# Patient Record
Sex: Male | Born: 1945 | Race: Black or African American | Hispanic: No | Smoking: Former smoker
Health system: Southern US, Community
[De-identification: ages and names within clinical notes are randomized; demographics above are authoritative.]

## PROBLEM LIST (undated history)

## (undated) DIAGNOSIS — E079 Disorder of thyroid, unspecified: Secondary | ICD-10-CM

## (undated) DIAGNOSIS — I739 Peripheral vascular disease, unspecified: Secondary | ICD-10-CM

## (undated) DIAGNOSIS — I2510 Atherosclerotic heart disease of native coronary artery without angina pectoris: Secondary | ICD-10-CM

## (undated) DIAGNOSIS — I10 Essential (primary) hypertension: Secondary | ICD-10-CM

## (undated) DIAGNOSIS — E119 Type 2 diabetes mellitus without complications: Secondary | ICD-10-CM

## (undated) DIAGNOSIS — I4892 Unspecified atrial flutter: Secondary | ICD-10-CM

## (undated) DIAGNOSIS — J449 Chronic obstructive pulmonary disease, unspecified: Secondary | ICD-10-CM

---

## 2016-07-23 ENCOUNTER — Inpatient Hospital Stay (HOSPITAL_COMMUNITY): Payer: Medicare Other

## 2016-07-23 ENCOUNTER — Encounter (HOSPITAL_COMMUNITY): Payer: Self-pay | Admitting: Emergency Medicine

## 2016-07-23 ENCOUNTER — Emergency Department (HOSPITAL_COMMUNITY): Payer: Medicare Other

## 2016-07-23 ENCOUNTER — Inpatient Hospital Stay (HOSPITAL_COMMUNITY)
Admission: EM | Admit: 2016-07-23 | Discharge: 2016-07-30 | DRG: 004 | Disposition: A | Discharge status: Other Acute Inpatient Hospital | Payer: Medicare Other | Attending: Internal Medicine | Admitting: Internal Medicine

## 2016-07-23 DIAGNOSIS — T17890A Other foreign object in other parts of respiratory tract causing asphyxiation, initial encounter: Principal | ICD-10-CM | POA: Diagnosis present

## 2016-07-23 DIAGNOSIS — I2510 Atherosclerotic heart disease of native coronary artery without angina pectoris: Secondary | ICD-10-CM | POA: Diagnosis not present

## 2016-07-23 DIAGNOSIS — Z430 Encounter for attention to tracheostomy: Secondary | ICD-10-CM | POA: Diagnosis not present

## 2016-07-23 DIAGNOSIS — I252 Old myocardial infarction: Secondary | ICD-10-CM

## 2016-07-23 DIAGNOSIS — J15212 Pneumonia due to Methicillin resistant Staphylococcus aureus: Secondary | ICD-10-CM | POA: Diagnosis present

## 2016-07-23 DIAGNOSIS — E785 Hyperlipidemia, unspecified: Secondary | ICD-10-CM | POA: Diagnosis present

## 2016-07-23 DIAGNOSIS — Z9981 Dependence on supplemental oxygen: Secondary | ICD-10-CM

## 2016-07-23 DIAGNOSIS — R579 Shock, unspecified: Secondary | ICD-10-CM | POA: Diagnosis not present

## 2016-07-23 DIAGNOSIS — Z955 Presence of coronary angioplasty implant and graft: Secondary | ICD-10-CM

## 2016-07-23 DIAGNOSIS — Y95 Nosocomial condition: Secondary | ICD-10-CM | POA: Diagnosis present

## 2016-07-23 DIAGNOSIS — J189 Pneumonia, unspecified organism: Secondary | ICD-10-CM | POA: Diagnosis present

## 2016-07-23 DIAGNOSIS — Z931 Gastrostomy status: Secondary | ICD-10-CM

## 2016-07-23 DIAGNOSIS — J9622 Acute and chronic respiratory failure with hypercapnia: Secondary | ICD-10-CM | POA: Diagnosis present

## 2016-07-23 DIAGNOSIS — J431 Panlobular emphysema: Secondary | ICD-10-CM | POA: Diagnosis not present

## 2016-07-23 DIAGNOSIS — E1151 Type 2 diabetes mellitus with diabetic peripheral angiopathy without gangrene: Secondary | ICD-10-CM | POA: Diagnosis present

## 2016-07-23 DIAGNOSIS — J9601 Acute respiratory failure with hypoxia: Secondary | ICD-10-CM | POA: Insufficient documentation

## 2016-07-23 DIAGNOSIS — R570 Cardiogenic shock: Secondary | ICD-10-CM

## 2016-07-23 DIAGNOSIS — J811 Chronic pulmonary edema: Secondary | ICD-10-CM | POA: Diagnosis present

## 2016-07-23 DIAGNOSIS — G931 Anoxic brain damage, not elsewhere classified: Secondary | ICD-10-CM | POA: Diagnosis not present

## 2016-07-23 DIAGNOSIS — J9611 Chronic respiratory failure with hypoxia: Secondary | ICD-10-CM | POA: Diagnosis not present

## 2016-07-23 DIAGNOSIS — I4892 Unspecified atrial flutter: Secondary | ICD-10-CM | POA: Diagnosis not present

## 2016-07-23 DIAGNOSIS — E876 Hypokalemia: Secondary | ICD-10-CM | POA: Diagnosis not present

## 2016-07-23 DIAGNOSIS — E872 Acidosis: Secondary | ICD-10-CM | POA: Diagnosis present

## 2016-07-23 DIAGNOSIS — X58XXXA Exposure to other specified factors, initial encounter: Secondary | ICD-10-CM | POA: Diagnosis not present

## 2016-07-23 DIAGNOSIS — D649 Anemia, unspecified: Secondary | ICD-10-CM | POA: Diagnosis not present

## 2016-07-23 DIAGNOSIS — J440 Chronic obstructive pulmonary disease with acute lower respiratory infection: Secondary | ICD-10-CM | POA: Diagnosis not present

## 2016-07-23 DIAGNOSIS — Z87891 Personal history of nicotine dependence: Secondary | ICD-10-CM | POA: Diagnosis not present

## 2016-07-23 DIAGNOSIS — I10 Essential (primary) hypertension: Secondary | ICD-10-CM | POA: Diagnosis not present

## 2016-07-23 DIAGNOSIS — J9621 Acute and chronic respiratory failure with hypoxia: Secondary | ICD-10-CM | POA: Diagnosis present

## 2016-07-23 DIAGNOSIS — R092 Respiratory arrest: Secondary | ICD-10-CM | POA: Diagnosis not present

## 2016-07-23 DIAGNOSIS — I469 Cardiac arrest, cause unspecified: Secondary | ICD-10-CM | POA: Diagnosis present

## 2016-07-23 HISTORY — DX: Disorder of thyroid, unspecified: E07.9

## 2016-07-23 HISTORY — DX: Peripheral vascular disease, unspecified: I73.9

## 2016-07-23 HISTORY — DX: Type 2 diabetes mellitus without complications: E11.9

## 2016-07-23 HISTORY — DX: Atherosclerotic heart disease of native coronary artery without angina pectoris: I25.10

## 2016-07-23 HISTORY — DX: Unspecified atrial flutter: I48.92

## 2016-07-23 HISTORY — DX: Essential (primary) hypertension: I10

## 2016-07-23 HISTORY — DX: Chronic obstructive pulmonary disease, unspecified: J44.9

## 2016-07-23 LAB — I-STAT ARTERIAL BLOOD GAS, ED
Acid-Base Excess: 4 mmol/L — ABNORMAL HIGH (ref 0.0–2.0)
Acid-Base Excess: 5 mmol/L — ABNORMAL HIGH (ref 0.0–2.0)
Bicarbonate: 33.7 mmol/L — ABNORMAL HIGH (ref 20.0–28.0)
Bicarbonate: 29 mmol/L — ABNORMAL HIGH (ref 20.0–28.0)
O2 Saturation: 76 %
O2 Saturation: 100 %
PCO2 ART: 78.6 mmHg — AB (ref 32.0–48.0)
PCO2 ART: 38 mmHg (ref 32.0–48.0)
PH ART: 7.236 — AB (ref 7.350–7.450)
PO2 ART: 48 mmHg — AB (ref 83.0–108.0)
Patient temperature: 97.2
Patient temperature: 97.2
TCO2: 36 mmol/L (ref 0–100)
TCO2: 30 mmol/L (ref 0–100)
pH, Arterial: 7.487 — ABNORMAL HIGH (ref 7.350–7.450)
pO2, Arterial: 227 mmHg — ABNORMAL HIGH (ref 83.0–108.0)

## 2016-07-23 LAB — CBC
HCT: 25.5 % — ABNORMAL LOW (ref 39.0–52.0)
Hemoglobin: 7.8 g/dL — ABNORMAL LOW (ref 13.0–17.0)
MCH: 27.3 pg (ref 26.0–34.0)
MCHC: 30.6 g/dL (ref 30.0–36.0)
MCV: 89.2 fL (ref 78.0–100.0)
PLATELETS: 292 10*3/uL (ref 150–400)
RBC: 2.86 MIL/uL — ABNORMAL LOW (ref 4.22–5.81)
RDW: 15.3 % (ref 11.5–15.5)
WBC: 15.4 10*3/uL — ABNORMAL HIGH (ref 4.0–10.5)

## 2016-07-23 LAB — COMPREHENSIVE METABOLIC PANEL
ALBUMIN: 2 g/dL — AB (ref 3.5–5.0)
ALK PHOS: 70 U/L (ref 38–126)
ALK PHOS: 86 U/L (ref 38–126)
ALT: 22 U/L (ref 17–63)
ALT: 29 U/L (ref 17–63)
AST: 60 U/L — AB (ref 15–41)
AST: 53 U/L — ABNORMAL HIGH (ref 15–41)
Albumin: 2.4 g/dL — ABNORMAL LOW (ref 3.5–5.0)
Anion gap: 8 (ref 5–15)
Anion gap: 14 (ref 5–15)
BILIRUBIN TOTAL: 0.8 mg/dL (ref 0.3–1.2)
BUN: 9 mg/dL (ref 6–20)
BUN: 16 mg/dL (ref 6–20)
CALCIUM: 7.3 mg/dL — AB (ref 8.9–10.3)
CALCIUM: 8.6 mg/dL — AB (ref 8.9–10.3)
CHLORIDE: 104 mmol/L (ref 101–111)
CO2: 27 mmol/L (ref 22–32)
CO2: 27 mmol/L (ref 22–32)
CREATININE: 0.86 mg/dL (ref 0.61–1.24)
CREATININE: 1.26 mg/dL — AB (ref 0.61–1.24)
Chloride: 94 mmol/L — ABNORMAL LOW (ref 101–111)
GFR calc Af Amer: >60 mL/min (ref >60)
GFR calc non Af Amer: >60 mL/min (ref >60)
GFR calc non Af Amer: 57 mL/min — ABNORMAL LOW (ref >60)
GLUCOSE: 158 mg/dL — AB (ref 65–99)
GLUCOSE: 141 mg/dL — AB (ref 65–99)
Potassium: 3.7 mmol/L (ref 3.5–5.1)
Potassium: 3.8 mmol/L (ref 3.5–5.1)
SODIUM: 139 mmol/L (ref 135–145)
SODIUM: 135 mmol/L (ref 135–145)
Total Bilirubin: 0.8 mg/dL (ref 0.3–1.2)
Total Protein: 6.4 g/dL — ABNORMAL LOW (ref 6.5–8.1)
Total Protein: 7.7 g/dL (ref 6.5–8.1)

## 2016-07-23 LAB — CBC WITH DIFFERENTIAL/PLATELET
Basophils Absolute: 0.1 10*3/uL (ref 0.0–0.1)
Basophils Absolute: 0 10*3/uL (ref 0.0–0.1)
Basophils Relative: 1 %
Basophils Relative: 0 %
EOS ABS: 0.2 10*3/uL (ref 0.0–0.7)
EOS ABS: 0 10*3/uL (ref 0.0–0.7)
EOS PCT: 2 %
Eosinophils Relative: 0 %
HCT: 28.8 % — ABNORMAL LOW (ref 39.0–52.0)
HEMATOCRIT: 33 % — AB (ref 39.0–52.0)
HEMOGLOBIN: 9.9 g/dL — AB (ref 13.0–17.0)
Hemoglobin: 8.5 g/dL — ABNORMAL LOW (ref 13.0–17.0)
LYMPHS ABS: 1.3 10*3/uL (ref 0.7–4.0)
LYMPHS ABS: 1.7 10*3/uL (ref 0.7–4.0)
LYMPHS PCT: 11 %
LYMPHS PCT: 8 %
MCH: 27.7 pg (ref 26.0–34.0)
MCH: 27.6 pg (ref 26.0–34.0)
MCHC: 29.5 g/dL — ABNORMAL LOW (ref 30.0–36.0)
MCHC: 30 g/dL (ref 30.0–36.0)
MCV: 93.8 fL (ref 78.0–100.0)
MCV: 91.9 fL (ref 78.0–100.0)
MONO ABS: 0.8 10*3/uL (ref 0.1–1.0)
Monocytes Absolute: 1.4 10*3/uL — ABNORMAL HIGH (ref 0.1–1.0)
Monocytes Relative: 7 %
Monocytes Relative: 7 %
NEUTROS ABS: 18 10*3/uL — AB (ref 1.7–7.7)
NEUTROS PCT: 85 %
Neutro Abs: 9.7 10*3/uL — ABNORMAL HIGH (ref 1.7–7.7)
Neutrophils Relative %: 79 %
PLATELETS: 307 10*3/uL (ref 150–400)
Platelets: 342 10*3/uL (ref 150–400)
RBC: 3.07 MIL/uL — AB (ref 4.22–5.81)
RBC: 3.59 MIL/uL — AB (ref 4.22–5.81)
RDW: 15.8 % — ABNORMAL HIGH (ref 11.5–15.5)
RDW: 15.6 % — ABNORMAL HIGH (ref 11.5–15.5)
WBC: 12.1 10*3/uL — AB (ref 4.0–10.5)
WBC: 21.1 10*3/uL — AB (ref 4.0–10.5)

## 2016-07-23 LAB — I-STAT CG4 LACTIC ACID, ED: Lactic Acid, Venous: 1.65 mmol/L (ref 0.5–1.9)

## 2016-07-23 LAB — URINALYSIS, MICROSCOPIC (REFLEX)

## 2016-07-23 LAB — URINALYSIS, ROUTINE W REFLEX MICROSCOPIC
Bilirubin Urine: NEGATIVE
Glucose, UA: NEGATIVE mg/dL
Ketones, ur: NEGATIVE mg/dL
Nitrite: NEGATIVE
Protein, ur: 100 mg/dL — AB
Specific Gravity, Urine: >1.03 — ABNORMAL HIGH (ref 1.005–1.030)
pH: 5.5 (ref 5.0–8.0)

## 2016-07-23 LAB — GLUCOSE, CAPILLARY
GLUCOSE-CAPILLARY: 161 mg/dL — AB (ref 65–99)
Glucose-Capillary: 130 mg/dL — ABNORMAL HIGH (ref 65–99)
Glucose-Capillary: 146 mg/dL — ABNORMAL HIGH (ref 65–99)

## 2016-07-23 LAB — MRSA PCR SCREENING: MRSA by PCR: NEGATIVE

## 2016-07-23 LAB — PROTIME-INR
INR: 1.24
PROTHROMBIN TIME: 15.7 s — AB (ref 11.4–15.2)

## 2016-07-23 LAB — ECHOCARDIOGRAM COMPLETE: Height: 68.898 in

## 2016-07-23 LAB — PHOSPHORUS: PHOSPHORUS: 5 mg/dL — AB (ref 2.5–4.6)

## 2016-07-23 LAB — I-STAT TROPONIN, ED: Troponin i, poc: 0.04 ng/mL (ref 0.00–0.08)

## 2016-07-23 LAB — MAGNESIUM: Magnesium: 1.8 mg/dL (ref 1.7–2.4)

## 2016-07-23 LAB — CREATININE, SERUM
Creatinine, Ser: 1.04 mg/dL (ref 0.61–1.24)
GFR calc Af Amer: >60 mL/min (ref >60)
GFR calc non Af Amer: >60 mL/min (ref >60)

## 2016-07-23 LAB — APTT: aPTT: 26 seconds (ref 24–36)

## 2016-07-23 LAB — LIPASE, BLOOD: Lipase: 21 U/L (ref 11–51)

## 2016-07-23 LAB — CORTISOL: Cortisol, Plasma: >100 ug/dL

## 2016-07-23 MED ORDER — INSULIN ASPART 100 UNIT/ML ~~LOC~~ SOLN
0.0000 [IU] | SUBCUTANEOUS | Status: DC
  Administered 2016-07-23: 2 [IU] via SUBCUTANEOUS
  Administered 2016-07-23: 3 [IU] via SUBCUTANEOUS
  Administered 2016-07-24 (×2): 2 [IU] via SUBCUTANEOUS
  Administered 2016-07-24: 3 [IU] via SUBCUTANEOUS
  Administered 2016-07-24 – 2016-07-29 (×5): 2 [IU] via SUBCUTANEOUS

## 2016-07-23 MED ORDER — ALBUTEROL SULFATE (2.5 MG/3ML) 0.083% IN NEBU
INHALATION_SOLUTION | RESPIRATORY_TRACT | Status: AC
  Administered 2016-07-23: 5 mg
  Filled 2016-07-23: qty 6

## 2016-07-23 MED ORDER — CHLORHEXIDINE GLUCONATE 0.12% ORAL RINSE (MEDLINE KIT)
15.0000 mL | Freq: Two times a day (BID) | OROMUCOSAL | Status: DC
  Administered 2016-07-23 – 2016-07-30 (×11): 15 mL via OROMUCOSAL

## 2016-07-23 MED ORDER — ROCURONIUM BROMIDE 50 MG/5ML IV SOLN
INTRAVENOUS | Status: AC | PRN
  Administered 2016-07-23: 100 mg via INTRAVENOUS

## 2016-07-23 MED ORDER — VANCOMYCIN HCL 10 G IV SOLR
1750.0000 mg | Freq: Once | INTRAVENOUS | Status: AC
  Administered 2016-07-23: 1750 mg via INTRAVENOUS
  Filled 2016-07-23: qty 1750

## 2016-07-23 MED ORDER — EPINEPHRINE PF 1 MG/ML IJ SOLN: 0.5000 ug/min | INTRAVENOUS | Status: DC

## 2016-07-23 MED ORDER — PIPERACILLIN-TAZOBACTAM 3.375 G IVPB
3.3750 g | Freq: Three times a day (TID) | INTRAVENOUS | Status: DC
  Administered 2016-07-23 – 2016-07-25 (×5): 3.375 g via INTRAVENOUS
  Filled 2016-07-23 (×6): qty 50

## 2016-07-23 MED ORDER — VANCOMYCIN HCL IN DEXTROSE 1-5 GM/200ML-% IV SOLN: 1000.0000 mg | Freq: Once | INTRAVENOUS | Status: DC

## 2016-07-23 MED ORDER — EPINEPHRINE PF 1 MG/ML IJ SOLN
0.5000 ug/min | INTRAVENOUS | Status: DC
  Administered 2016-07-23: 0.533 ug/min via INTRAVENOUS
  Filled 2016-07-23: qty 4

## 2016-07-23 MED ORDER — NOREPINEPHRINE BITARTRATE 1 MG/ML IV SOLN
0.0000 ug/min | Freq: Once | INTRAVENOUS | Status: AC
  Administered 2016-07-23: 5 ug/min via INTRAVENOUS
  Filled 2016-07-23: qty 4

## 2016-07-23 MED ORDER — NOREPINEPHRINE BITARTRATE 1 MG/ML IV SOLN
0.0000 ug/min | INTRAVENOUS | Status: DC
  Administered 2016-07-23: 10 ug/min via INTRAVENOUS
  Administered 2016-07-23: 7 ug/min via INTRAVENOUS
  Filled 2016-07-23 (×2): qty 4

## 2016-07-23 MED ORDER — HYDROCORTISONE NA SUCCINATE PF 100 MG IJ SOLR
50.0000 mg | Freq: Four times a day (QID) | INTRAMUSCULAR | Status: DC
  Filled 2016-07-23: qty 2

## 2016-07-23 MED ORDER — HYDROCORTISONE NA SUCCINATE PF 100 MG IJ SOLR
50.0000 mg | Freq: Four times a day (QID) | INTRAMUSCULAR | Status: DC
  Administered 2016-07-23 – 2016-07-24 (×3): 50 mg via INTRAVENOUS
  Filled 2016-07-23 (×3): qty 1

## 2016-07-23 MED ORDER — ETOMIDATE 2 MG/ML IV SOLN
INTRAVENOUS | Status: AC | PRN
  Administered 2016-07-23: 20 mg via INTRAVENOUS

## 2016-07-23 MED ORDER — HEPARIN SODIUM (PORCINE) 5000 UNIT/ML IJ SOLN
5000.0000 [IU] | Freq: Three times a day (TID) | INTRAMUSCULAR | Status: DC
  Administered 2016-07-23 – 2016-07-30 (×20): 5000 [IU] via SUBCUTANEOUS
  Filled 2016-07-23 (×22): qty 1

## 2016-07-23 MED ORDER — ALBUTEROL SULFATE (2.5 MG/3ML) 0.083% IN NEBU: 5.0000 mg | INHALATION_SOLUTION | Freq: Once | RESPIRATORY_TRACT | Status: DC

## 2016-07-23 MED ORDER — VANCOMYCIN HCL IN DEXTROSE 750-5 MG/150ML-% IV SOLN
750.0000 mg | Freq: Two times a day (BID) | INTRAVENOUS | Status: DC
  Administered 2016-07-24 – 2016-07-26 (×6): 750 mg via INTRAVENOUS
  Filled 2016-07-23 (×6): qty 150

## 2016-07-23 MED ORDER — EPINEPHRINE PF 1 MG/10ML IJ SOSY
PREFILLED_SYRINGE | INTRAMUSCULAR | Status: AC | PRN
  Administered 2016-07-23: 1 via INTRAVENOUS

## 2016-07-23 MED ORDER — PANTOPRAZOLE SODIUM 40 MG IV SOLR
40.0000 mg | INTRAVENOUS | Status: DC
  Administered 2016-07-23 – 2016-07-24 (×2): 40 mg via INTRAVENOUS
  Filled 2016-07-23 (×3): qty 40

## 2016-07-23 MED ORDER — PIPERACILLIN-TAZOBACTAM 3.375 G IVPB 30 MIN
3.3750 g | Freq: Once | INTRAVENOUS | Status: AC
  Administered 2016-07-23: 3.375 g via INTRAVENOUS
  Filled 2016-07-23: qty 50

## 2016-07-23 MED ORDER — ALBUTEROL SULFATE (2.5 MG/3ML) 0.083% IN NEBU
2.5000 mg | INHALATION_SOLUTION | RESPIRATORY_TRACT | Status: DC
  Administered 2016-07-23 – 2016-07-28 (×31): 2.5 mg via RESPIRATORY_TRACT
  Filled 2016-07-23 (×31): qty 3

## 2016-07-23 MED ORDER — SODIUM CHLORIDE 0.9 % IV SOLN
INTRAVENOUS | Status: DC
  Administered 2016-07-23: 13:00:00 via INTRAVENOUS

## 2016-07-23 MED ORDER — ORAL CARE MOUTH RINSE
15.0000 mL | Freq: Four times a day (QID) | OROMUCOSAL | Status: DC
  Administered 2016-07-23 – 2016-07-30 (×21): 15 mL via OROMUCOSAL

## 2016-07-23 MED FILL — Medication: Qty: 1 | Status: AC

## 2016-07-23 NOTE — Progress Notes (Signed)
Pt w/ high PIP alarms.  Spoke w/ Dr Dorris Fetchasa, decreased VT to 8cc/kg, giving albuterol HHN now.  RN aware.

## 2016-07-23 NOTE — ED Notes (Signed)
CCM at bedside bronching patient

## 2016-07-23 NOTE — Procedures (Signed)
Percutaneous Tracheostomy Placement  Emergently done, no consent acquired.  Patient position.  Placed on 100% FiO2 and RR matched.  Area cleaned and draped.  Trachea palpated then punctured, catheter passed and visualized bronchoscopically.  Wire placed and visualized.  Catheter removed.  Airway then dilated.  Size 6 cuffed XLT trach placed and visualized bronchoscopically well above carina.  Good volume returns.  Patient tolerated the procedure well without complications.  Minimal blood loss.  CXR ordered and pending.  Alyson ReedyWesam G. Yacoub, M.D. [facility]. Pager: [phone]. After hours pager: [phone].

## 2016-07-23 NOTE — ED Notes (Signed)
At this time patient becoming more alert, mouthing questions & following commands. Nodding head appropriately to questions. Oriented to surroundings and plan of care.

## 2016-07-23 NOTE — ED Notes (Signed)
MD electing to intubate.

## 2016-07-23 NOTE — Progress Notes (Signed)
eLink Physician-Brief Progress Note Patient Name: Tony JunesWillie [name] DOB: 1946-04-18 MRN: [id number]   Date of Service  07/23/2016  HPI/Events of Note  S/p cardiac arrest  eICU Interventions  Best practice measures:ordered hep sq and PPI     Intervention Category Evaluation Type: Other  Erin FullingKurian Kerron Sedano 07/23/2016, 3:34 PM

## 2016-07-23 NOTE — ED Notes (Signed)
Patient placed on zoll pads, monitor, continuous pulse oximetry and blood pressure cuff; RT maintaining airway; patient has a trach

## 2016-07-23 NOTE — ED Triage Notes (Addendum)
[facility] becoming unresponsive this morning. Per staff, nurse checked on patient during rounds this morning, patient was responding to his baseline and nodding appropriately to questions. Nurse came back to the room 10 minutes later, patient eyes noted to be rolled back and patient was pulseless. CPR was initiated with 1 epi given, ROSC obtained. Pt proceeded to code again, with one epi given again and ROSC was obtained. Pt remained hypotensive in route by carelink.   Pt admitted to [facility] for cardiac arrest early December treated for cardiogenic shock. Ultimately was unable to wean off the vent, obtained a PEG tube, PICC,  trach, and foley and sent to specialty hospital. At baseline patient is alert and able to nod appropriately to questions, and is interactive. Patient is a full code.

## 2016-07-23 NOTE — ED Notes (Signed)
On arrival to patient room patient trach not moving air properly, MD Long at bedside.  Pt on zoll, HR 70. Pt not responsive at this time, eyes rolled back.

## 2016-07-23 NOTE — Progress Notes (Signed)
eLink Physician-Brief Progress Note Patient Name: Apolinar JunesWillie Helbling DOB: 1946/04/02 MRN: [id number]   Date of Service  07/23/2016  HPI/Events of Note  Watery diarrhea and elevated WBC  eICU Interventions  Enteric precautions and check for c diff     Intervention Category Evaluation Type: Other  Erin FullingKurian Asenath Balash 07/23/2016, 3:54 PM

## 2016-07-23 NOTE — Consult Note (Signed)
CARDIOLOGY CONSULT NOTE   Patient ID: Tony [name] MRN: [id number] DOB/AGE: 11-11-1945 71 y.o.  Admit date: 07/23/2016  Primary Physician   Pcp Not In System Primary Cardiologist   New Reason for Consultation   Cardiac Arrest Requesting Physician  Dr.Yacoub  HPI: Tony [name] is a 71 y.o. male with a history of CAD, PVD, HTN, HLD, aflutter s/p ablation, COPD, DM, current tobacco abuse and untreated sleep apnea who presents with cardiac arrest.   Hx of PVD: a. 08/14/2006 Abdominal aortography with bilateral lower extremity runoff: high grade right CIA stenosis, total occlusion of left SFA with reconstitution via bridging collateral from PFA with 2-vessel run-off below the knee on the right, and 1.5 vessel run-off below the knee on the left. b. 08/25/2006 Angioplasty and stenting of bilateral CIAs/aortoiliac bifurcation with 8 x 57 mm stents c. 12/12 S/P insertion of iliac artery stent   Hx of CAD: 08/14/2006 Cardiac cath: left main 10% ostial, LAD mid 10%, LCx mid 20%, RCA mid 70%. Direct RCA- stenting with 3.0/18 Driver BMS  09/29/8117 Transesophageal Echocardiogram:  1. No LA, LAA, RA or RAA thrombus. 2. No inter-atrial shunt. 3. Mild LV dysfunction, EF 40-45%. 4. Moderate MR. Trivial PR. Trivial AR.  07/10/2015 Lexiscan Nuclear Stress Test: Abnormal lexiscan nuclear stress test. Systolic function moderately impaired with an LVEF 38%: global mild hypokinesis with superimposed severe hypokinesis anteriorly but normal myocardial perfusion, plus there is basal inferior infarct (medium, severe, fixed defect with associated akinesis). No ischemia.   Admitted 05/27/17-06/19/17 @ [facility] post cardiac arrest, NSTEMI and empirically treated for HCAP. She diuresed. Echo showed normal LV function. She required trach/peg and discharged to long term care facility [facility] where he again had cardiac arrest x 2 today requiring CPR. [facility] patient had PEA requiring CPR for 4 minutes. PT intubated and  trach is removed. EKG showed sinus tachycardia at rate of 122 bpm with non specific TWI. POC troponin is 0.04.   Past Medical History:  Diagnosis Date  . CAD (coronary artery disease)   . COPD (chronic obstructive pulmonary disease) (HCC)   . Coronary artery disease   . Diabetes mellitus without complication (HCC)   . Hypertension   . PVD (peripheral vascular disease) (HCC)    a. 08/14/2006 Abdominal aortography with bilateral lower extremity runoff: high grade right CIA stenosis, total occlusion of left SFA with reconstitution via bridging collateral from PFA with 2-vessel run-off below the knee on the right, and 1.5 vessel run-off below the knee on the left. b. 08/25/2006 Angioplasty and stenting of bilateral CIAs/aortoiliac bifurcation with 8 x 57 mm stents c. 12/12   . Thyroid disease       I have reviewed the patient's current medications . hydrocortisone sodium succinate  50 mg Intravenous Q6H  . insulin aspart  0-15 Units Subcutaneous Q4H   . sodium chloride 100 mL/hr at 07/23/16 1305  . epinephrine Stopped (07/23/16 1252)  . norepinephrine (LEVOPHED) Adult infusion 7 mcg/min (07/23/16 1305)  . piperacillin-tazobactam    . vancomycin     EPINEPHrine, etomidate, rocuronium  Prior to Admission medications   Medication Sig Start Date End Date Taking? Authorizing Provider  aspirin 81 MG chewable tablet Place 81 mg into feeding tube daily.   Yes Historical Provider, MD  chlorhexidine (PERIDEX) 0.12 % solution Use as directed 15 mLs in the mouth or throat every 12 (twelve) hours.   Yes Historical Provider, MD  clonazePAM (KLONOPIN) 0.25 MG disintegrating tablet Place 0.25 mg into feeding tube 2 (  two) times daily.   Yes Historical Provider, MD  clopidogrel (PLAVIX) 75 MG tablet Place 75 mg into feeding tube daily.   Yes Historical Provider, MD  furosemide (LASIX) 40 MG tablet Place 40 mg into feeding tube daily.   Yes Historical Provider, MD  gabapentin (NEURONTIN) 250 MG/5ML solution  Place 100 mg into feeding tube 3 (three) times daily.   Yes Historical Provider, MD  heparin 5000 UNIT/ML injection Inject 5,000 Units into the skin every 8 (eight) hours.   Yes Historical Provider, MD  HYDRALAZINE HCL IJ Inject 20 mg into the vein every 4 (four) hours as needed. HTN for SBP >170 or DBP>100   Yes Historical Provider, MD  insulin regular (NOVOLIN R,HUMULIN R) 250 units/2.29mL (100 units/mL) injection Inject 0-16 Units into the skin 3 (three) times daily before meals. Inject as per sliding scale if 70-150=0 for blood glucose 70mg /dl implement hypoglycemia protocol; 151-200=4 units, 201-250=8 units, 251-300=10 units, 301-350=12 units, 351-400=16 units, >400 give 16 units, notify MD, subcutaneously twice daily for DM2   Yes Historical Provider, MD  levothyroxine (SYNTHROID, LEVOTHROID) 125 MCG tablet Place 125 mcg into feeding tube daily before breakfast.   Yes Historical Provider, MD  lidocaine (LIDODERM) 5 % Place 1 patch onto the skin daily. Remove & Discard patch within 12 hours or as directed by MD   Yes Historical Provider, MD  Melatonin 3 MG TABS Place 3 mg into feeding tube at bedtime.   Yes Historical Provider, MD  metoprolol tartrate (LOPRESSOR) 25 MG tablet Place 25 mg into feeding tube every 6 (six) hours.   Yes Historical Provider, MD  ondansetron (ZOFRAN) 4 MG/5ML solution Place 4 mg into feeding tube every 6 (six) hours as needed for nausea or vomiting.   Yes Historical Provider, MD  oxyCODONE (OXY IR/ROXICODONE) 5 MG immediate release tablet Place 5 mg into feeding tube every 6 (six) hours as needed for severe pain.   Yes Historical Provider, MD  pantoprazole sodium (PROTONIX) 40 mg/20 mL PACK Place 40 mg into feeding tube daily.   Yes Historical Provider, MD  phenol (CHLORASEPTIC) 1.4 % LIQD Use as directed 1 spray in the mouth or throat every 6 (six) hours as needed for throat irritation / pain.   Yes Historical Provider, MD  polyethylene glycol (MIRALAX / GLYCOLAX) packet  Place 17 g into feeding tube daily.   Yes Historical Provider, MD  senna-docusate (SENNA S) 8.6-50 MG tablet Place 1 tablet into feeding tube daily.   Yes Historical Provider, MD  sodium bicarbonate 650 MG tablet Place 650 mg into feeding tube every 4 (four) hours as needed for heartburn. Clogged tube or GT   Yes Historical Provider, MD  tizanidine (ZANAFLEX) 2 MG capsule Place 2 mg into feeding tube every 4 (four) hours as needed for muscle spasms.   Yes Historical Provider, MD     Social History   Social History  . Marital status: Unknown    Spouse name: N/A  . Number of children: N/A  . Years of education: N/A   Occupational History  . Not on file.   Social History Main Topics  . Smoking status: Former Smoker    Types: Cigarettes  . Smokeless tobacco: Never Used  . Alcohol use No  . Drug use: Unknown  . Sexual activity: Not on file   Other Topics Concern  . Not on file   Social History Narrative  . No narrative on file    Family History: family history includes Coronary artery  disease in his father and mother; Diabetes mellitus in his brother and father; Diabetes type II in his brother and father; Heart attack (age of onset: 46) in his brother; Heart attack (age of onset: 13) in his mother; Heart attack (age of onset: 59) in his father; Heart disease in his brother, father, mother, and sister; Hypertension in his father and mother; Kidney failure in his brother. There is no history of Anesthesia problems or Malignant hyperthermia.   ROS:  Full 14 point review of systems complete and found to be negative unless listed above.  Physical Exam: Blood pressure (!) 71/55, temperature 97.2 F (36.2 C), temperature source Axillary, resp. rate 26, height 5' 8.9" (1.75 m), SpO2 100 %.  General: tracheotomy on ventilator, alert, awake, follows commands, tries to talk Lungs: Resp regular and unlabored, CTA. Heart: RRR no s3, s4, or murmurs..   Neck: No carotid bruits. No  lymphadenopathy.  JVD. Abdomen: Bowel sounds present, abdomen soft and non-tender without masses or hernias noted. Msk:  No spine or cva tenderness. No weakness, no joint deformities or effusions. Extremities: No clubbing, cyanosis or edema. DP/PT/Radials 2+ and equal bilaterally. Neuro: Alert and oriented X 3. No focal deficits noted. Psych:  Good affect, responds appropriately Skin: No rashes or lesions noted.  Labs:   Lab Results  Component Value Date   WBC 12.1 (H) 07/23/2016   HGB 8.5 (L) 07/23/2016   HCT 28.8 (L) 07/23/2016   MCV 93.8 07/23/2016   PLT 307 07/23/2016   No results for input(s): INR in the last 72 hours.   Recent Labs Lab 07/23/16 1044  NA 139  K 3.7  CL 104  CO2 27  BUN 9  CREATININE 0.86  CALCIUM 7.3*  PROT 6.4*  BILITOT 0.8  ALKPHOS 70  ALT 22  AST 60*  GLUCOSE 158*  ALBUMIN 2.0*   No results found for: MG No results for input(s): CKTOTAL, CKMB, TROPONINI in the last 72 hours.  Recent Labs  07/23/16 1052  TROPIPOC 0.04   No results found for: PROBNP No results found for: CHOL, HDL, LDLCALC, TRIG No results found for: DDIMER Lipase  Date/Time Value Ref Range Status  07/23/2016 10:44 AM 21 11 - 51 U/L Final   No results found for: TSH, T4TOTAL, T3FREE, THYROIDAB No results found for: VITAMINB12, FOLATE, FERRITIN, TIBC, IRON, RETICCTPCT  Echo: 06/28/16 INTERPRETATION TECHNICALLY LIMITED STUDY DUE TO POOR SOUND TRANSMISSION NORMAL LEFT VENTRICULAR FUNCTION HOWEVER CAN NOT ASSESS FOR REGIONAL WALL MOTION ABNORMALITIES NORMAL RIGHT VENTRICULAR SYSTOLIC FUNCTION NO VALVULAR STENOSIS TRIVIAL REGURGITATION NOTED (See above) MILD LVH DILATED IVC    Radiology:  Ct Head Wo Contrast  Result Date: 07/23/2016 CLINICAL DATA:  Found unresponsive today EXAM: CT HEAD WITHOUT CONTRAST TECHNIQUE: Contiguous axial images were obtained from the base of the skull through the vertex without intravenous contrast. COMPARISON:  None. FINDINGS: Brain:  The ventricular system is within normal limits in size for age and only minimal prominence of cortical sulci is noted. Bilateral benign-appearing basal ganglial calcifications are present. The septum is midline in position. No hemorrhage, mass lesion, or acute infarction is seen. Vascular: No vascular abnormality is seen on this unenhanced study. Skull: On bone window images, no calvarial abnormality is seen. Sinuses/Orbits: There is a small amount of fluid layering dependently within the sphenoid sinus. The remainder of the paranasal sinuses are well pneumatized. Other: None. IMPRESSION: 1. No acute intracranial abnormality. 2. Small amount of fluid layers within the sphenoid sinus. Electronically Signed  By: Dwyane DeePaul  Barry M.D.   On: 07/23/2016 14:11   Dg Chest Port 1 View  Result Date: 07/23/2016 CLINICAL DATA:  Acute respiratory failure with hypoxemia.  Post CPR. EXAM: PORTABLE CHEST 1 VIEW COMPARISON:  07/23/2016 FINDINGS: Cardiomediastinal silhouette is unchanged. Pulmonary vascular congestion, interstitial edema, bilateral lower lung airspace disease/atelectasis again noted. An endotracheal tube is present with tip 7 cm above the carina, and right PICC line with tip overlying the mid SVC again noted. A defibrillator pad overlying the chest is present. There is no evidence of pneumothorax. IMPRESSION: Little significant change with interstitial edema and bilateral lower lung atelectasis/airspace disease. Electronically Signed   By: Harmon PierJeffrey  Hu M.D.   On: 07/23/2016 13:32   Dg Chest Portable 1 View  Result Date: 07/23/2016 CLINICAL DATA:  Recent cardiac arrest EXAM: PORTABLE CHEST 1 VIEW COMPARISON:  None. FINDINGS: Cardiac shadow is within normal limits. Endotracheal tube is noted in satisfactory position just above the aortic knob. Right-sided PICC line is noted in satisfactory position. Diffuse parenchymal changes are noted bilaterally but worst in the right lung base. IMPRESSION: Endotracheal tube in  satisfactory position. Scattered parenchymal densities most prominent in the right lower lobe likely related to acute infiltrate. Electronically Signed   By: Alcide CleverMark  Lukens M.D.   On: 07/23/2016 11:18    ASSESSMENT AND PLAN:     1.  Cardiac arrest (HCC) x 3 today - required 1 rounds of CPR in ER. EKG with non specific TWI. [facility] Will update echo here.   2. CAD s/p BMS to RCA in 2008 - Per records last myoview 06/2015 showed no ischemia  3. PVD - As above  Signed: Bhagat,Bhavinkumar, PA 07/23/2016, 2:19 PM Pager [phone]  Co-Sign MD  The patient was seen, examined and discussed with Bhagat,Bhavinkumar PA-C and I agree with the above.   71 y.o. male with a history of CAD, s/p PCI to RCA in 2008, PVD, HTN, HLD, aflutter s/p ablation, COPD on chronic O2 at home, DM, TEE in 2/17 showed LVEF 40-45%. Stress test in 1/17 showed prior inferior infarct and no ischemia.  Admitted 05/27/17-06/19/17 @ [facility] post cardiac arrest, NSTEMI and empirically treated for HCAP. Echo showed normal LV function. She required trach/peg and discharged to long term care facility [facility] where he again had cardiac arrest x 2 today requiring CPR, per ER physician it was PEA arrest with quick recovery with epinephrine and short CPR. In ER again brady- PEA seemed to be related to respiratory failure, CPR for 4 minutes. PT intubated and trach was removed. EKG showed sinus tachycardia at rate of 122 bpm with non specific TWI. POC troponin is 0.04.  I have seen the patient in 80[facility] - medical ICU, he is following commands, responds to questions with noding, denies chest pain. Currently on low dose levophed 15 mcg/kg/min. I have talked to his niece and sister. They state that he had a cath in [facility] but no stent was placed. We will obtain records as this wasn't mentioned in the discharge note. We will obtain an echocardiogram. Troponin and ECG not suggestive of an acute coronary event. It appears that his  arrests are related to an underlying respiratory failure. We will follow.   Tobias AlexanderKatarina Deyani Hegarty, MD 07/23/2016

## 2016-07-23 NOTE — Progress Notes (Signed)
Pharmacy Antibiotic Note  Tony [name] is a 71 y.o. male admitted on 07/23/2016 with pneumonia.  Pharmacy has been consulted for vancomycin and zosyn dosing. Patient received CPR briefly and was intubated shortly after arriving. Patient height and weight were obtained from care everywhere visit from 06/18/16.   Patient received vancomycin 1750mg  and zosyn 3.375g IV once in the ED.  Plan: Vancomycin 750mg  IV every 12 hours.  Goal trough 15-20 mcg/mL. Zosyn 3.375g IV q8h (4 hour infusion).  Monitor culture data, renal function and clinical course [facility]  Height: 5' 8.9" (175 cm) IBW/kg (Calculated) : 70.47  Dosing Weight: 107.8 kg  Temp (24hrs), Avg:97.2 F (36.2 C), Min:97.2 F (36.2 C), Max:97.2 F (36.2 C)   Recent Labs Lab 07/23/16 1044 07/23/16 1054  WBC 12.1*  --   CREATININE 0.86  --   LATICACIDVEN  --  1.65    CrCl cannot be calculated (Unknown ideal weight.).    Not on File   Arlean HoppingCorey M. Newman PiesBall, PharmD, BCPS Clinical Pharmacist [phone] 07/23/2016 1:08 PM

## 2016-07-23 NOTE — H&P (Addendum)
PULMONARY / CRITICAL CARE MEDICINE   Name: Tony [name] MRN: [id number] DOB: Aug 06, 1945    ADMISSION DATE:  07/23/2016 CONSULTATION DATE:  07/23/2016  REFERRING MD:  EDP - Dr. Jacqulyn Bath  CHIEF COMPLAINT:  Cardiac arrest and respiratory failure  HISTORY OF PRESENT ILLNESS:   71 year old male with previous cardiac arrest in [facility] that required a trach/peg but evidently was neurologically intact.  Patient cardiac arrested in [facility] with 2 rounds of CPR in [facility] and one round in [place].  They were unable to move air after the third code and decision was made to intubate from above and remove trach.  EDP communicated with sister who is her main decision maker and full code status confirmed.  Patient remained completely unresponsive and in refractory shock post third code and PCCM was called to admit.  PAST MEDICAL HISTORY :  He  has a past medical history of COPD (chronic obstructive pulmonary disease) (HCC); Coronary artery disease; Diabetes mellitus without complication (HCC); Hypertension; and Thyroid disease.  PAST SURGICAL HISTORY: He  has no past surgical history on file.  Not on File  No current facility-administered medications on file prior to encounter.    No current outpatient prescriptions on file prior to encounter.    FAMILY HISTORY:  His has no family status information on file.    SOCIAL HISTORY: He  reports that he has quit smoking. His smoking use included Cigarettes. He has never used smokeless tobacco. He reports that he does not drink alcohol.  REVIEW OF SYSTEMS:   Unattainable  SUBJECTIVE:  Cardiac arrest  VITAL SIGNS: BP 114/68   Temp 97.2 F (36.2 C) (Axillary)   Resp 24   SpO2 100%   HEMODYNAMICS:    VENTILATOR SETTINGS:    INTAKE / OUTPUT: No intake/output data recorded.  PHYSICAL EXAMINATION: General:  Chronically ill appearing male with trach/peg, completely unresponsive Neuro:  Unresponsive, only has a respiratory drive HEENT:  [place]/AT,  pupils fixed and dilated. Cardiovascular:  RRR, Nl S1/S2, -M/R/G Lungs:  Coarse BS diffusely Abdomen:  Soft, NT, ND and +BS Musculoskeletal:  -edema and -tenderness Skin:  Intact  LABS:  BMET  Recent Labs Lab 07/23/16 1044  NA 139  K 3.7  CL 104  CO2 27  BUN 9  CREATININE 0.86  GLUCOSE 158*    Electrolytes  Recent Labs Lab 07/23/16 1044  CALCIUM 7.3*    CBC  Recent Labs Lab 07/23/16 1044  WBC 12.1*  HGB 8.5*  HCT 28.8*  PLT 307    Coag's No results for input(s): APTT, INR in the last 168 hours.  Sepsis Markers  Recent Labs Lab 07/23/16 1054  LATICACIDVEN 1.65    ABG  Recent Labs Lab 07/23/16 1156  PHART 7.236*  PCO2ART 78.6*  PO2ART 48.0*    Liver Enzymes  Recent Labs Lab 07/23/16 1044  AST 60*  ALT 22  ALKPHOS 70  BILITOT 0.8  ALBUMIN 2.0*    Cardiac Enzymes No results for input(s): TROPONINI, PROBNP in the last 168 hours.  Glucose No results for input(s): GLUCAP in the last 168 hours.  Imaging Dg Chest Portable 1 View  Result Date: 07/23/2016 CLINICAL DATA:  Recent cardiac arrest EXAM: PORTABLE CHEST 1 VIEW COMPARISON:  None. FINDINGS: Cardiac shadow is within normal limits. Endotracheal tube is noted in satisfactory position just above the aortic knob. Right-sided PICC line is noted in satisfactory position. Diffuse parenchymal changes are noted bilaterally but worst in the right lung base. IMPRESSION: Endotracheal tube in  satisfactory position. Scattered parenchymal densities most prominent in the right lower lobe likely related to acute infiltrate. Electronically Signed   By: Alcide CleverMark  Lukens M.D.   On: 07/23/2016 11:18     STUDIES:  CT head 1/31>>> EEG 1/31>>>  CULTURES: Blood 1/31>>> Urine 1/31>>> Sputum 1/31>>>  ANTIBIOTICS: Vancomycin 1/31>>> Zosyn 1/31>>>  SIGNIFICANT EVENTS:   LINES/TUBES: ETT 1/31>>> Perc re-do trach 1/31>>> PICC (pta)>>>  DISCUSSION: 71yo male with complex PMH including recent  [facility], now at [facility] trach/PEG dependent.  [facility], then [facility].  Unable to bag pt through trach in ER so he was intubated orally.  Aprrox 25 mins total CPR.    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  Unable to bag adequately through trach so intubated orally in ER.  Hx COPD  Respiratory acidosis  P:   Plan re-do trach now while stoma fresh Vent support  F/u ABG  F/u CXR  Increase RR to 24   CARDIOVASCULAR Cardiac arrest - unclear etiology.  ?respiratory given difficulty with airway on arrival to [facility]  Refractory shock - likely cardiogenic  Hx CAD - initial troponin neg  Hx HTN  P:  No cooling r/t refractory shock - remains on epi and norepi gtt  Cardiology to see  Trend troponin  2D echo  EKG  Stress steroids   RENAL No active issue  P:   F/u chem   GASTROINTESTINAL No active issue  P:   NPO  PPI    HEMATOLOGIC Anemia - likely chronic  P:  F/u cbc  SQ heparin   INFECTIOUS No obvious source infection - u/a, cxr ok.  NO sig leukocytosis.  Does have indwelling PICC and PEG but both sites c/d. PICC in place since December.  P:   Pct, lactate  Pan culture  Trend wbc, fever curve off abx for now   ENDOCRINE DM  P:   SSI   NEUROLOGIC AMS - post cardiac arrest with ~25 mins downtime.  Unclear baseline neuro status but reportedly was neurologically intact after previous arrest.  P:   RASS goal: -1 CT head  EEG   FAMILY  - Updates: Family arrived, after a long discussion, no CPR/cardioversion but continue support until neuro status is declared.  Will change code status.  - Inter-disciplinary family meet or Palliative Care meeting due by:  day 7  The patient is critically ill with multiple organ systems failure and requires high complexity decision making for assessment and support,  frequent evaluation and titration of therapies, application of advanced monitoring technologies and extensive interpretation of multiple databases.   Critical Care Time devoted to patient care services described in this note is  45  Minutes. This time reflects time of care of this signee Dr Koren BoundWesam Yacoub. This critical care time does not reflect procedure time, or teaching time or supervisory time of PA/NP/Med student/Med Resident etc but could involve care discussion time.  Alyson ReedyWesam G. Yacoub, M.D. [facility] Pulmonary/Critical Care Medicine. Pager: [phone]. After hours pager: [phone].  07/23/2016, 12:49 PM

## 2016-07-23 NOTE — Procedures (Signed)
Bronchoscopy Procedure Note Tony JunesWillie [name] [id number] 08-22-1945  Procedure: Bronchoscopy Indications: Diagnostic evaluation of the airways  Procedure Details Consent: Unable to obtain consent because of emergent medical necessity. Time Out: Verified patient identification, verified procedure, site/side was marked, verified correct patient position, special equipment/implants available, medications/allergies/relevent history reviewed, required imaging and test results available.  Performed  In preparation for procedure, patient was given 100% FiO2 and bronchoscope lubricated. Sedation: None  Airway entered and the following bronchi were examined: RUL, RML, RLL, LUL, LLL and Bronchi.   Bloody secretions throughout Bronchoscope removed.    Evaluation Hemodynamic Status: BP stable throughout; O2 sats: stable throughout Patient's Current Condition: stable Specimens:  None Complications: No apparent complications Patient did tolerate procedure well.   Tony BoundYACOUB,Tony [name] 07/23/2016

## 2016-07-23 NOTE — Progress Notes (Signed)
Patient placed on our ventilator and moved over onto bed from stretcher. Patient not getting his volumes on ventilator. Absent breath sounds noted and MD made aware. Patient bagged through mouth until airway established via intubation. Once intubated, after coding and getting pulses back, placed on ventilator again getting good volumes with diminished, expiratory wheezes throughout lung fields. Neb treatment to be given. Will continue to monitor.

## 2016-07-23 NOTE — Procedures (Signed)
Intubation Procedure Note Tony [name] [id number] [birth year]  Procedure: Intubation Indications: Respiratory insufficiency  Procedure Details Consent: Unable to obtain consent because of emergent medical necessity. Time Out: Verified patient identification, verified procedure, site/side was marked, verified correct patient position, special equipment/implants available, medications/allergies/relevent history reviewed, required imaging and test results available.  Performed  Maximum sterile technique was used including cap, gloves, gown, hand hygiene and mask.  MAC and 3    Evaluation Hemodynamic Status: Persistent hypotension treated with pressors; O2 sats: currently acceptable and while coding Patient's Current Condition: unstable Complications: No apparent complications Patient did tolerate procedure well. Chest X-ray ordered to verify placement.  CXR: pending.   Myrtie Neither 07/23/2016

## 2016-07-23 NOTE — Care Management Note (Signed)
Case Management Note  Patient Details  Name: Tony [name] MRN: [id number] Date of Birth: 08/07/1945  Subjective/Objective:       Pt is [name]/p Cardiac Arrest  - emergent trach placed 07/23/16            Action/Plan:   PTA from [facility].     Expected Discharge Date:                  Expected Discharge Plan:  Skilled Nursing Facility (from [facility])  In-House Referral:  Clinical Social Work  Discharge planning Services  CM Consult  Post Acute Care Choice:    Choice offered to:     DME Arranged:    DME Agency:     HH Arranged:    HH Agency:     Status of Service:  In process, will continue to follow  If discussed at Long Length of Stay Meetings, dates discussed:    Additional Comments:  Tony [name], Tony Oshea S, RN 07/23/2016, 2:22 PM

## 2016-07-23 NOTE — ED Provider Notes (Signed)
Emergency Department Provider Note   I have reviewed the triage vital signs and the nursing notes.  Level 5 caveat: Cardiac arrest and trach dependent.   HISTORY  Chief Complaint Cardiac Arrest   HPI Tony [name] is a 71 y.o. male with complicated PMH including cardiac arrest at [facility] with resulting trach/g-tube dependence coming to us from his nursing facility after cardiac arrest x 2. No clear precipitating factor. [facility] stated they were given little information on scene. The patient did have 2 PEA arrests at the facility and apparently responded well to Epinepherine. EMS report sudden increase in peak pressures on arrival to the emergency department. He became very difficult to bag. They did not lose pulses in route.    Past Medical History:  Diagnosis Date  . Atrial flutter (HCC)    s/p ablation 2017  . CAD (coronary artery disease)    /22/2008 Cardiac cath: left main 10% ostial, LAD mid 10%, LCx mid 20%, RCA mid 70%. Direct RCA- stenting with 3.0/18 Driver BMS  . COPD (chronic obstructive pulmonary disease) (HCC)   . Coronary artery disease   . Diabetes mellitus without complication (HCC)   . Hypertension   . PVD (peripheral vascular disease) (HCC)    a. 08/14/2006 Abdominal aortography with bilateral lower extremity runoff: high grade right CIA stenosis, total occlusion of left SFA with reconstitution via bridging collateral from PFA with 2-vessel run-off below the knee on the right, and 1.5 vessel run-off below the knee on the left. b. 08/25/2006 Angioplasty and stenting of bilateral CIAs/aortoiliac bifurcation with 8 x 57 mm stents c. 12/12   . Thyroid disease     Patient Active Problem List   Diagnosis Date Noted  . Cardiac arrest (HCC) 07/23/2016  . Acute respiratory failure with hypoxemia (HCC)     History reviewed. No pertinent surgical history.    Allergies Patient has no allergy information on record.  No family history on file.  Social  History Social History  Substance Use Topics  . Smoking status: Former Smoker    Types: Cigarettes  . Smokeless tobacco: Never Used  . Alcohol use No    Review of Systems  Level 5 caveat: Cardiac arrest patient. Intubated.   ____________________________________________   PHYSICAL EXAM:  VITAL SIGNS: Temp: 97.2 F Pulse: 106 Resp: 20 BP: 69/48 SPO2 100% on vent   Constitutional: Difficult to bag via trach. Diaphoretic with cool extremities.  Eyes: Conjunctivae are normal. Pupils are sluggish bilaterally.  Head: Atraumatic. Nose: No congestion/rhinnorhea. Mouth/Throat: Mucous membranes are dry.  Neck: No stridor. Difficult to bag. No resolution with tach suction.  Cardiovascular: Sinus tachycardia. Thready pulse. Cool distal extremities.  Respiratory: Increased WOB and absent air movement bilaterally.  Gastrointestinal: Soft. No distention. PEG tube in place.  Musculoskeletal: No gross deformities of extremities. Neurologic: GCS 3.  Skin: Cool. No rash noted.  ____________________________________________   LABS (all labs ordered are listed, but only abnormal results are displayed)  Labs Reviewed  COMPREHENSIVE METABOLIC PANEL - Abnormal; Notable for the following:       Result Value   Glucose, Bld 158 (*)    Calcium 7.3 (*)    Total Protein 6.4 (*)    Albumin 2.0 (*)    AST 60 (*)    All other components within normal limits  CBC WITH DIFFERENTIAL/PLATELET - Abnormal; Notable for the following:    WBC 12.1 (*)    RBC 3.07 (*)    Hemoglobin 8.5 (*)    HCT  28.8 (*)    MCHC 29.5 (*)    RDW 15.8 (*)    Neutro Abs 9.7 (*)    All other components within normal limits  URINALYSIS, ROUTINE W REFLEX MICROSCOPIC - Abnormal; Notable for the following:    APPearance CLOUDY (*)    Specific Gravity, Urine >1.030 (*)    Hgb urine dipstick LARGE (*)    Protein, ur 100 (*)    Leukocytes, UA SMALL (*)    All other components within normal limits  URINALYSIS,  MICROSCOPIC (REFLEX) - Abnormal; Notable for the following:    Bacteria, UA MANY (*)    Squamous Epithelial / LPF 0-5 (*)    All other components within normal limits  CBC WITH DIFFERENTIAL/PLATELET - Abnormal; Notable for the following:    WBC 21.1 (*)    RBC 3.59 (*)    Hemoglobin 9.9 (*)    HCT 33.0 (*)    RDW 15.6 (*)    Neutro Abs 18.0 (*)    Monocytes Absolute 1.4 (*)    All other components within normal limits  GLUCOSE, CAPILLARY - Abnormal; Notable for the following:    Glucose-Capillary 161 (*)    All other components within normal limits  I-STAT ARTERIAL BLOOD GAS, ED - Abnormal; Notable for the following:    pH, Arterial 7.236 (*)    pCO2 arterial 78.6 (*)    pO2, Arterial 48.0 (*)    Bicarbonate 33.7 (*)    Acid-Base Excess 4.0 (*)    All other components within normal limits  I-STAT ARTERIAL BLOOD GAS, ED - Abnormal; Notable for the following:    pH, Arterial 7.487 (*)    pO2, Arterial 227.0 (*)    Bicarbonate 29.0 (*)    Acid-Base Excess 5.0 (*)    All other components within normal limits  CULTURE, BLOOD (ROUTINE X 2)  CULTURE, BLOOD (ROUTINE X 2)  URINE CULTURE  MRSA PCR SCREENING  CULTURE, RESPIRATORY (NON-EXPECTORATED)  C DIFFICILE QUICK SCREEN W PCR REFLEX  LIPASE, BLOOD  COMPREHENSIVE METABOLIC PANEL  APTT  PROTIME-INR  BLOOD GAS, ARTERIAL  CORTISOL  MAGNESIUM  PHOSPHORUS  CBC  CREATININE, SERUM  I-STAT CG4 LACTIC ACID, ED  I-STAT TROPOININ, ED   ____________________________________________  EKG   EKG Interpretation  Date/Time:  Wednesday July 23 2016 11:00:15 EST Ventricular Rate:  122 PR Interval:    QRS Duration: 100 QT Interval:  299 QTC Calculation: 426 R Axis:   85 Text Interpretation:  Sinus tachycardia Paired ventricular premature complexes Aberrant conduction of SV complex(es) Probable left atrial enlargement Borderline right axis deviation Borderline T abnormalities, diffuse leads No STEMI.  Confirmed by LONG MD, JOSHUA  [phone]) on 07/23/2016 11:13:05 AM Also confirmed by LONG MD, JOSHUA [phone]), editor [place] CT, Jola Babinski [phone])  on 07/23/2016 11:21:44 AM       ____________________________________________  RADIOLOGY  Ct Head Wo Contrast  Result Date: 07/23/2016 CLINICAL DATA:  Found unresponsive today EXAM: CT HEAD WITHOUT CONTRAST TECHNIQUE: Contiguous axial images were obtained from the base of the skull through the vertex without intravenous contrast. COMPARISON:  None. FINDINGS: Brain: The ventricular system is within normal limits in size for age and only minimal prominence of cortical sulci is noted. Bilateral benign-appearing basal ganglial calcifications are present. The septum is midline in position. No hemorrhage, mass lesion, or acute infarction is seen. Vascular: No vascular abnormality is seen on this unenhanced study. Skull: On bone window images, no calvarial abnormality is seen. Sinuses/Orbits: There is a small amount  of fluid layering dependently within the sphenoid sinus. The remainder of the paranasal sinuses are well pneumatized. Other: None. IMPRESSION: 1. No acute intracranial abnormality. 2. Small amount of fluid layers within the sphenoid sinus. Electronically Signed   By: Dwyane Dee M.D.   On: 07/23/2016 14:11   Dg Chest Port 1 View  Result Date: 07/23/2016 CLINICAL DATA:  Acute respiratory failure with hypoxemia.  Post CPR. EXAM: PORTABLE CHEST 1 VIEW COMPARISON:  07/23/2016 FINDINGS: Cardiomediastinal silhouette is unchanged. Pulmonary vascular congestion, interstitial edema, bilateral lower lung airspace disease/atelectasis again noted. An endotracheal tube is present with tip 7 cm above the carina, and right PICC line with tip overlying the mid SVC again noted. A defibrillator pad overlying the chest is present. There is no evidence of pneumothorax. IMPRESSION: Little significant change with interstitial edema and bilateral lower lung atelectasis/airspace disease. Electronically Signed    By: Harmon Pier M.D.   On: 07/23/2016 13:32   Dg Chest Portable 1 View  Result Date: 07/23/2016 CLINICAL DATA:  Recent cardiac arrest EXAM: PORTABLE CHEST 1 VIEW COMPARISON:  None. FINDINGS: Cardiac shadow is within normal limits. Endotracheal tube is noted in satisfactory position just above the aortic knob. Right-sided PICC line is noted in satisfactory position. Diffuse parenchymal changes are noted bilaterally but worst in the right lung base. IMPRESSION: Endotracheal tube in satisfactory position. Scattered parenchymal densities most prominent in the right lower lobe likely related to acute infiltrate. Electronically Signed   By: Alcide Clever M.D.   On: 07/23/2016 11:18    ____________________________________________   PROCEDURES  Procedure(s) performed:   Procedure Name: Intubation Date/Time: 07/23/2016 1:09 PM Performed by: Maia Plan Pre-anesthesia Checklist: Emergency Drugs available, Suction available and Patient being monitored Oxygen Delivery Method: Ambu bag Intubation Type: Rapid sequence Ventilation: Mask ventilation with difficulty Laryngoscope Size: Glidescope and 4 Tube size: 7.5 mm Number of attempts: 1 Airway Equipment and Method: Video-laryngoscopy Placement Confirmation: ETT inserted through vocal cords under direct vision,  Positive ETCO2,  CO2 detector and Breath sounds checked- equal and bilateral Secured at: 25 cm Tube secured with: ETT holder Difficulty Due To: Difficulty was anticipated Comments: Patient unable to vent through trach. Was successfully intubated orally and trach was removed with simultaneous placement of the ETT.         CRITICAL CARE Performed by: Maia Plan Total critical care time: 60 minutes Critical care time was exclusive of separately billable procedures and treating other patients. Critical care was necessary to treat or prevent imminent or life-threatening deterioration. Critical care was time spent personally by me  on the following activities: development of treatment plan with patient and/or surrogate as well as nursing, discussions with consultants, evaluation of patient's response to treatment, examination of patient, obtaining history from patient or surrogate, ordering and performing treatments and interventions, ordering and review of laboratory studies, ordering and review of radiographic studies, pulse oximetry and re-evaluation of patient's condition.  Alona Bene, MD Emergency Medicine  ____________________________________________   INITIAL IMPRESSION / ASSESSMENT AND PLAN / ED COURSE  Pertinent labs & imaging results that were available during my care of the patient were reviewed by me and considered in my medical decision making (see chart for details).  Patient presents from his nursing facility after 2 episodes of PEA arrest. He responded to epinephrine. Shortly after arrival in the emergency department the patient's peak pressures increased dramatically and he became very difficult to bag. The trach did not suction easily. Patient was disconnected from the ventilator  and we began bagging the patient manually. Patient had bradycardia and went into a third PEA arrest. Patient was given epinephrine chest compressions and got return of spontaneous circulation. Patient had some grimacing after ROSC but otherwise not making intentional movements. The patient continued to be very difficult to bag through the trach and oral tracheal intubation was attempted successfully. The trach was removed. There was positive color change and the patient became much easier to bag. Epinephrine infusion started through his existing PICC line. We'll obtain an ABG, labs, chest x-ray and discuss with family and Critical Care.   11:00 AM Attempted to call the patient's relative listed as his emergency contact in Epic discuss his goals of care and CODE STATUS. Given his multiple cardiac arrests today and baseline very poor  functioning of low suspicion for a good outcome after the events of today. When calling the relative the phone and went straight to voicemail. I did leave a message for the relative to call me back.   12:30 PM Critical care at bedside. Patient now on Epi and NE infusions. Awaiting family for care discussion.   Patient admitted to ICU.  ____________________________________________  FINAL CLINICAL IMPRESSION(S) / ED DIAGNOSES  Final diagnoses:  Acute respiratory failure with hypoxemia (HCC)     MEDICATIONS GIVEN DURING THIS VISIT:  Medications  EPINEPHrine (ADRENALIN) 4 mg in dextrose 5 % 250 mL (0.016 mg/mL) infusion (0 mcg/min Intravenous Stopped 07/23/16 1252)  0.9 %  sodium chloride infusion ( Intravenous New Bag/Given 07/23/16 1305)  norepinephrine (LEVOPHED) 4 mg in dextrose 5 % 250 mL (0.016 mg/mL) infusion (15 mcg/min Intravenous Rate/Dose Change 07/23/16 1444)  insulin aspart (novoLOG) injection 0-15 Units (3 Units Subcutaneous Given 07/23/16 1530)  piperacillin-tazobactam (ZOSYN) IVPB 3.375 g (not administered)  vancomycin (VANCOCIN) 1,750 mg in sodium chloride 0.9 % 500 mL IVPB (1,750 mg Intravenous Given 07/23/16 1530)  albuterol (PROVENTIL) (2.5 MG/3ML) 0.083% nebulizer solution 5 mg (not administered)  hydrocortisone sodium succinate (SOLU-CORTEF) 100 MG injection 50 mg (50 mg Intravenous Given 07/23/16 1529)  heparin injection 5,000 Units (not administered)  pantoprazole (PROTONIX) injection 40 mg (not administered)  EPINEPHrine (ADRENALIN) 1 MG/10ML injection (1 Syringe Intravenous Given 07/23/16 1047)  etomidate (AMIDATE) injection (20 mg Intravenous Given 07/23/16 1055)  rocuronium (ZEMURON) injection (100 mg Intravenous Given 07/23/16 1056)  albuterol (PROVENTIL) (2.5 MG/3ML) 0.083% nebulizer solution (5 mg  Given by Other 07/23/16 1109)  norepinephrine (LEVOPHED) 4 mg in dextrose 5 % 250 mL (0.016 mg/mL) infusion (7 mcg/min Intravenous Rate/Dose Change 07/23/16 1217)      NEW OUTPATIENT MEDICATIONS STARTED DURING THIS VISIT:  None   Note:  This document was prepared using Dragon voice recognition software and may include unintentional dictation errors.  Alona Bene, MD Emergency Medicine   Maia Plan, MD 07/23/16 [phone]

## 2016-07-23 NOTE — Progress Notes (Signed)
EEG could not be done at this time, pt having an echo. Will attempt in the morning of 2/1

## 2016-07-23 NOTE — Progress Notes (Signed)
eLink Physician-Brief Progress Note Patient Name: Tony JunesWillie [name] DOB: Jul 10, 1945 MRN: [id number]   Date of Service  07/23/2016  HPI/Events of Note  Increased Peak pressures  eICU Interventions  Decrease TV BD therapy ordered     Intervention Category Major Interventions: Respiratory failure - evaluation and management  Erin FullingKurian Vestal Crandall 07/23/2016, 4:54 PM

## 2016-07-23 NOTE — Progress Notes (Signed)
Patient transported on ventilator to CT and up to 2M03 with no complications.

## 2016-07-23 NOTE — Progress Notes (Signed)
eLink Physician-Brief Progress Note Patient Name: Tony JunesWillie [name] DOB: 1946-06-06 MRN: [id number]   Date of Service  07/23/2016  HPI/Events of Note  Camera check S/p cardiac arrest on vent, on vasopressors fio2 at 80%, on Levo 15 mcg  eICU Interventions  Continue supportive care and medical care     Intervention Category Evaluation Type: Other  Tony [name] 07/23/2016, 3:09 PM

## 2016-07-23 NOTE — Progress Notes (Signed)
eLink Physician-Brief Progress Note Patient Name: Tony JunesWillie Schildt DOB: [birth year] MRN: [id number]   Date of Service  07/23/2016  HPI/Events of Note  Case reviewed by Dr Annita BrodYacoub-states that he has spoken to family about code status-no CPR, no cardioversion  eICU Interventions  Will place limited code in orders as per Dr Molli KnockYacoub     Intervention Category Evaluation Type: Other  Erin FullingKurian Prerna Harold 07/23/2016, 4:29 PM

## 2016-07-24 ENCOUNTER — Inpatient Hospital Stay (HOSPITAL_COMMUNITY): Payer: Medicare Other

## 2016-07-24 DIAGNOSIS — J189 Pneumonia, unspecified organism: Secondary | ICD-10-CM | POA: Diagnosis present

## 2016-07-24 LAB — BASIC METABOLIC PANEL
ANION GAP: 11 (ref 5–15)
Anion gap: 7 (ref 5–15)
BUN: 14 mg/dL (ref 6–20)
BUN: 18 mg/dL (ref 6–20)
CALCIUM: 7.5 mg/dL — AB (ref 8.9–10.3)
CALCIUM: 8.2 mg/dL — AB (ref 8.9–10.3)
CHLORIDE: 102 mmol/L (ref 101–111)
CO2: 25 mmol/L (ref 22–32)
CO2: 29 mmol/L (ref 22–32)
CREATININE: 0.99 mg/dL (ref 0.61–1.24)
Chloride: 100 mmol/L — ABNORMAL LOW (ref 101–111)
Creatinine, Ser: 0.92 mg/dL (ref 0.61–1.24)
GFR calc non Af Amer: >60 mL/min (ref >60)
Glucose, Bld: 165 mg/dL — ABNORMAL HIGH (ref 65–99)
Glucose, Bld: 121 mg/dL — ABNORMAL HIGH (ref 65–99)
POTASSIUM: 3.1 mmol/L — AB (ref 3.5–5.1)
Potassium: 3.3 mmol/L — ABNORMAL LOW (ref 3.5–5.1)
SODIUM: 138 mmol/L (ref 135–145)
Sodium: 136 mmol/L (ref 135–145)

## 2016-07-24 LAB — MAGNESIUM
MAGNESIUM: 1.5 mg/dL — AB (ref 1.7–2.4)
MAGNESIUM: 2.1 mg/dL (ref 1.7–2.4)
Magnesium: 2 mg/dL (ref 1.7–2.4)

## 2016-07-24 LAB — GLUCOSE, CAPILLARY
GLUCOSE-CAPILLARY: 155 mg/dL — AB (ref 65–99)
GLUCOSE-CAPILLARY: 130 mg/dL — AB (ref 65–99)
GLUCOSE-CAPILLARY: 115 mg/dL — AB (ref 65–99)
Glucose-Capillary: 129 mg/dL — ABNORMAL HIGH (ref 65–99)
Glucose-Capillary: 105 mg/dL — ABNORMAL HIGH (ref 65–99)

## 2016-07-24 LAB — CBC
HCT: 22.2 % — ABNORMAL LOW (ref 39.0–52.0)
Hemoglobin: 7 g/dL — ABNORMAL LOW (ref 13.0–17.0)
MCH: 27.6 pg (ref 26.0–34.0)
MCHC: 31.5 g/dL (ref 30.0–36.0)
MCV: 87.4 fL (ref 78.0–100.0)
PLATELETS: 236 10*3/uL (ref 150–400)
RBC: 2.54 MIL/uL — AB (ref 4.22–5.81)
RDW: 15.4 % (ref 11.5–15.5)
WBC: 9.7 10*3/uL (ref 4.0–10.5)

## 2016-07-24 LAB — URINE CULTURE

## 2016-07-24 LAB — PHOSPHORUS
PHOSPHORUS: 2.6 mg/dL (ref 2.5–4.6)
Phosphorus: 2.4 mg/dL — ABNORMAL LOW (ref 2.5–4.6)
Phosphorus: 2.1 mg/dL — ABNORMAL LOW (ref 2.5–4.6)

## 2016-07-24 MED ORDER — HYDROMORPHONE HCL 1 MG/ML IJ SOLN
0.2500 mg | INTRAMUSCULAR | Status: DC | PRN
  Administered 2016-07-24 – 2016-07-27 (×8): 0.5 mg via INTRAVENOUS
  Filled 2016-07-24 (×8): qty 1

## 2016-07-24 MED ORDER — MIDAZOLAM HCL 2 MG/2ML IJ SOLN: 1.0000 mg | INTRAMUSCULAR | Status: DC | PRN

## 2016-07-24 MED ORDER — VITAL HIGH PROTEIN PO LIQD
1000.0000 mL | ORAL | Status: DC
  Administered 2016-07-24: 1000 mL
  Administered 2016-07-24 – 2016-07-25 (×5)
  Administered 2016-07-25: 1000 mL
  Administered 2016-07-25 (×8)
  Administered 2016-07-26 – 2016-07-29 (×4): 1000 mL
  Filled 2016-07-24 (×6): qty 1000

## 2016-07-24 MED ORDER — PRO-STAT SUGAR FREE PO LIQD
30.0000 mL | Freq: Four times a day (QID) | ORAL | Status: DC
  Administered 2016-07-24 – 2016-07-30 (×24): 30 mL
  Filled 2016-07-24 (×23): qty 30

## 2016-07-24 MED ORDER — POTASSIUM CHLORIDE 2 MEQ/ML IV SOLN
30.0000 meq | Freq: Once | INTRAVENOUS | Status: AC
  Administered 2016-07-24: 30 meq via INTRAVENOUS
  Filled 2016-07-24: qty 15

## 2016-07-24 MED ORDER — FENTANYL CITRATE (PF) 100 MCG/2ML IJ SOLN
50.0000 ug | INTRAMUSCULAR | Status: DC | PRN
  Filled 2016-07-24: qty 2

## 2016-07-24 MED ORDER — POTASSIUM CHLORIDE 20 MEQ/15ML (10%) PO SOLN
40.0000 meq | Freq: Once | ORAL | Status: AC
  Administered 2016-07-24: 40 meq
  Filled 2016-07-24: qty 30

## 2016-07-24 MED ORDER — PRO-STAT SUGAR FREE PO LIQD
30.0000 mL | Freq: Two times a day (BID) | ORAL | Status: DC
  Administered 2016-07-24: 30 mL
  Filled 2016-07-24 (×2): qty 30

## 2016-07-24 MED ORDER — WHITE PETROLATUM GEL
Status: AC
  Administered 2016-07-24: 1
  Filled 2016-07-24: qty 1

## 2016-07-24 MED ORDER — MAGNESIUM SULFATE 2 GM/50ML IV SOLN
2.0000 g | Freq: Once | INTRAVENOUS | Status: AC
  Administered 2016-07-24: 2 g via INTRAVENOUS
  Filled 2016-07-24: qty 50

## 2016-07-24 MED ORDER — FUROSEMIDE 10 MG/ML IJ SOLN
40.0000 mg | Freq: Every day | INTRAMUSCULAR | Status: DC
  Administered 2016-07-24: 40 mg via INTRAVENOUS
  Filled 2016-07-24 (×2): qty 4

## 2016-07-24 MED ORDER — NAPHAZOLINE-GLYCERIN 0.012-0.2 % OP SOLN
1.0000 [drp] | Freq: Four times a day (QID) | OPHTHALMIC | Status: DC | PRN
  Administered 2016-07-24: 2 [drp] via OPHTHALMIC
  Administered 2016-07-24: 1 [drp] via OPHTHALMIC
  Administered 2016-07-25 – 2016-07-27 (×2): 2 [drp] via OPHTHALMIC
  Filled 2016-07-24: qty 15

## 2016-07-24 MED ORDER — FENTANYL CITRATE (PF) 100 MCG/2ML IJ SOLN: 50.0000 ug | INTRAMUSCULAR | Status: DC | PRN

## 2016-07-24 NOTE — Progress Notes (Signed)
eLink Physician-Brief Progress Note Patient Name: Tony JunesWillie [name] DOB: [birth year] MRN: [id number]   Date of Service  07/24/2016  HPI/Events of Note    eICU Interventions  Hypokalemia -repleted      Intervention Category Intermediate Interventions: Electrolyte abnormality - evaluation and management  ALVA,RAKESH V. 07/24/2016, 7:46 PM

## 2016-07-24 NOTE — Progress Notes (Signed)
eLink Physician-Brief Progress Note Patient Name: Tony JunesWillie [name] DOB: [birth year] MRN: [id number]   Date of Service  07/24/2016  HPI/Events of Note  K+ = 3.1, Mg++ = 1.5 and Creatinine = 0.92.  eICU Interventions  Will order: 1. Replace K+ and Mg++.     Intervention Category Major Interventions: Electrolyte abnormality - evaluation and management  Shalayah Beagley Eugene 07/24/2016, 6:16 AM

## 2016-07-24 NOTE — Progress Notes (Addendum)
Initial Nutrition Assessment  DOCUMENTATION CODES:   Obesity unspecified  INTERVENTION:    Vital High Protein at 40 ml/h (960 ml per day)   Prostat 30 ml QID  Provides 1360 kcals, 144 gm protein, 803 ml free water daily.  NUTRITION DIAGNOSIS:   Inadequate oral intake related to inability to eat as evidenced by NPO status.  GOAL:   Provide needs based on ASPEN/SCCM guidelines  MONITOR:   Vent status, Labs, Weight trends, TF tolerance, I & O's  REASON FOR ASSESSMENT:   Consult Enteral/tube feeding initiation and management  ASSESSMENT:   71 year old male with previous cardiac arrest in [facility] that required a trach/peg but evidently was neurologically intact.  Patient cardiac arrested in [facility] with 2 rounds of CPR in [facility] and one round in [place].  They were unable to move air after the third code and decision was made to intubate from above and remove trach.   Discussed patient in ICU rounds and with RN today. Received MD Consult for TF initiation and management. Nutrition-Focused physical exam completed. Findings are no fat depletion, mild-moderate muscle depletion, and mild edema.  Patient is currently intubated on ventilator support MV: 11.8 L/min Temp (24hrs), Avg:98.5 F (36.9 C), Min:97.5 F (36.4 C), Max:99.6 F (37.6 C)  Labs reviewed: potassium 3.1, magnesium 1.5 CBG's: 155-129 Medications reviewed and include Lasix and KCl.  Diet Order:  Diet NPO time specified  Skin:  Reviewed, no issues  Last BM:  unknown  Height:   Ht Readings from Last 1 Encounters:  07/23/16 5' 8.9" (1.75 m)    Weight:   Wt Readings from Last 1 Encounters:  07/24/16 207 lb 0.2 oz (93.9 kg)    Ideal Body Weight:  72.7 kg  BMI:  Body mass index is 30.66 kg/m.  Estimated Nutritional Needs:   Kcal:  [id number]  Protein:  145 gm  Fluid:  2 L  EDUCATION NEEDS:   No education needs identified at this time  Joaquin CourtsKimberly Jlynn Ly, RD, LDN, CNSC Pager [phone] After  Hours Pager [phone]

## 2016-07-24 NOTE — Progress Notes (Signed)
RN called d/t pt desat 83%.  Fio2 increased to 40% w/ 100% fio2 x 2 minutes.  Sat now 93%

## 2016-07-24 NOTE — Progress Notes (Signed)
Wasted 50mcg in sink with Marigene Ehlersyril Forcha RN. Pt refused med.

## 2016-07-24 NOTE — Procedures (Signed)
ELECTROENCEPHALOGRAM REPORT  Date of Study: 07/24/2016  Patient's Name: Tony [name] MRN: [id number] Date of Birth: 07/14/1945  Referring Provider: Ozzie HoyleKatalina M. Janyth ContesEubanks, NP  Clinical History: 71 year old man status post cardiac arrest  Medications: albuterol (PROVENTIL) (2.5 MG/3ML)  EPINEPHrine (ADRENALIN) 4 mg in dextrose 5 % 250 mL (0.016 mg/mL) infusion  hydrocortisone sodium succinate (SOLU-CORTEF) 100 MG injection 50 mg  nsulin aspart (novoLOG) injection 0-15 Units  norepinephrine (LEVOPHED) 4 mg in dextrose 5 % 250 mL (0.016 mg/mL) infusion  pantoprazole (PROTONIX) injection 40 mg  piperacillin-tazobactam (ZOSYN) IVPB 3.375 g  potassium chloride 30 mEq in sodium chloride 0.9 % 265 mL (KCL MULTIRUN) IVPB  vancomycin (VANCOCIN) IVPB 750 mg/150 ml premix  Technical Summary: A multichannel digital EEG recording measured by the international 10-20 system with electrodes applied with paste and impedances below 5000 ohms performed in our laboratory with EKG monitoring in an awake but mostly drowsy patient.  Hyperventilation and photic stimulation were not performed.  The digital EEG was referentially recorded, reformatted, and digitally filtered in a variety of bipolar and referential montages for optimal display.    Description: The patient is awake and drowsy during the recording.  During maximal wakefulness, there is a symmetric, medium voltage 7-8 Hz posterior dominant rhythm that attenuates with eye opening.  The record is symmetric.  Stage 2 sleep is not seen.  There were no epileptiform discharges or electrographic seizures seen.    EKG lead was unremarkable.  Impression: This awake and drowsy EEG is essentially normal.    Clinical Correlation: A normal EEG does not exclude a clinical diagnosis of epilepsy.  If further clinical questions remain, prolonged EEG may be helpful.  Clinical correlation is advised.   Shon MilletAdam Asjia Berrios, DO

## 2016-07-24 NOTE — H&P (Signed)
PULMONARY / CRITICAL CARE MEDICINE   Name: Apolinar JunesWillie Kozakiewicz MRN: [id number] DOB: 1946-03-14    ADMISSION DATE:  07/23/2016 CONSULTATION DATE:  07/23/2016  REFERRING MD:  EDP - Dr. Jacqulyn BathLong  CHIEF COMPLAINT:  Cardiac arrest and respiratory failure  HISTORY OF PRESENT ILLNESS:   71 year old male with previous cardiac arrest in [facility] that required a trach/peg but evidently was neurologically intact.  Patient cardiac arrested in [facility] with 2 rounds of CPR in [facility] and one round [place].  They were unable to move air after the third code and decision was made to intubate from above and remove trach.  EDP communicated with sister who is her main decision maker and full code status confirmed.  Patient remained completely unresponsive and in refractory shock post third code and PCCM was called to admit.  SUBJECTIVE:  Off pressors. Awake, follows commands.  Not on sedation. No diarrhea and pt is on miralax at [facility]. No fever.    VITAL SIGNS: BP 104/61   Pulse 84   Temp 99.6 F (37.6 C) (Oral)   Resp 20   Ht 5' 8.9" (1.75 m)   Wt 93.9 kg (207 lb 0.2 oz)   SpO2 94%   BMI 30.66 kg/m   HEMODYNAMICS:    VENTILATOR SETTINGS: Vent Mode: PRVC FiO2 (%):  [40 %-100 %] 40 % Set Rate:  [20 bmp-24 bmp] 20 bmp Vt Set:  [550 mL-620 mL] 560 mL PEEP:  [5 cmH20] 5 cmH20 Plateau Pressure:  [16 cmH20-24 cmH20] 16 cmH20  INTAKE / OUTPUT: I/O last 3 completed shifts: In: 2284.7 [I.V.:2184.7; IV Piggyback:100] Out: 580 [Urine:580]  PHYSICAL EXAMINATION: General:  Chronically ill appearing male with trach/peg, awake, follows commands Neuro:  CN grossly intact. (-) lateralizing signs HEENT:  [place]/AT, PERLA. (-) NVD Cardiovascular:  RRR, Nl S1/S2, -M/R/G Lungs:  Good ae. (+) trache. Rhonchi Bilaterally Abdomen:  Soft, NT, ND and +BS. (+) PEG tube Musculoskeletal:  -edema and -tenderness Skin:  Intact  LABS:  BMET  Recent Labs Lab 07/23/16 1044 07/23/16 1540 07/23/16 1810 07/24/16 0440  NA  139 135  --  136  K 3.7 3.8  --  3.1*  CL 104 94*  --  100*  CO2 27 27  --  25  BUN 9 16  --  14  CREATININE 0.86 1.26* 1.04 0.92  GLUCOSE 158* 141*  --  165*    Electrolytes  Recent Labs Lab 07/23/16 1044 07/23/16 1540 07/24/16 0440  CALCIUM 7.3* 8.6* 7.5*  MG  --  1.8 1.5*  PHOS  --  5.0* 2.6    CBC  Recent Labs Lab 07/23/16 1540 07/23/16 1810 07/24/16 0440  WBC 21.1* 15.4* 9.7  HGB 9.9* 7.8* 7.0*  HCT 33.0* 25.5* 22.2*  PLT 342 292 236    Coag's  Recent Labs Lab 07/23/16 1540  APTT 26  INR 1.24    Sepsis Markers  Recent Labs Lab 07/23/16 1054  LATICACIDVEN 1.65    ABG  Recent Labs Lab 07/23/16 1156 07/23/16 1309  PHART 7.236* 7.487*  PCO2ART 78.6* 38.0  PO2ART 48.0* 227.0*    Liver Enzymes  Recent Labs Lab 07/23/16 1044 07/23/16 1540  AST 60* 53*  ALT 22 29  ALKPHOS 70 86  BILITOT 0.8 0.8  ALBUMIN 2.0* 2.4*    Cardiac Enzymes No results for input(s): TROPONINI, PROBNP in the last 168 hours.  Glucose  Recent Labs Lab 07/23/16 1440 07/23/16 2041 07/23/16 2334 07/24/16 0406 07/24/16 0729  GLUCAP 161* 146*  130* 155* 129*    Imaging Ct Head Wo Contrast  Result Date: 07/23/2016 CLINICAL DATA:  Found unresponsive today EXAM: CT HEAD WITHOUT CONTRAST TECHNIQUE: Contiguous axial images were obtained from the base of the skull through the vertex without intravenous contrast. COMPARISON:  None. FINDINGS: Brain: The ventricular system is within normal limits in size for age and only minimal prominence of cortical sulci is noted. Bilateral benign-appearing basal ganglial calcifications are present. The septum is midline in position. No hemorrhage, mass lesion, or acute infarction is seen. Vascular: No vascular abnormality is seen on this unenhanced study. Skull: On bone window images, no calvarial abnormality is seen. Sinuses/Orbits: There is a small amount of fluid layering dependently within the sphenoid sinus. The remainder of the  paranasal sinuses are well pneumatized. Other: None. IMPRESSION: 1. No acute intracranial abnormality. 2. Small amount of fluid layers within the sphenoid sinus. Electronically Signed   By: Dwyane Dee M.D.   On: 07/23/2016 14:11   Dg Chest Port 1 View  Result Date: 07/23/2016 CLINICAL DATA:  Acute respiratory failure with hypoxemia.  Post CPR. EXAM: PORTABLE CHEST 1 VIEW COMPARISON:  07/23/2016 FINDINGS: Cardiomediastinal silhouette is unchanged. Pulmonary vascular congestion, interstitial edema, bilateral lower lung airspace disease/atelectasis again noted. An endotracheal tube is present with tip 7 cm above the carina, and right PICC line with tip overlying the mid SVC again noted. A defibrillator pad overlying the chest is present. There is no evidence of pneumothorax. IMPRESSION: Little significant change with interstitial edema and bilateral lower lung atelectasis/airspace disease. Electronically Signed   By: Harmon Pier M.D.   On: 07/23/2016 13:32   Dg Chest Portable 1 View  Result Date: 07/23/2016 CLINICAL DATA:  Recent cardiac arrest EXAM: PORTABLE CHEST 1 VIEW COMPARISON:  None. FINDINGS: Cardiac shadow is within normal limits. Endotracheal tube is noted in satisfactory position just above the aortic knob. Right-sided PICC line is noted in satisfactory position. Diffuse parenchymal changes are noted bilaterally but worst in the right lung base. IMPRESSION: Endotracheal tube in satisfactory position. Scattered parenchymal densities most prominent in the right lower lobe likely related to acute infiltrate. Electronically Signed   By: Alcide Clever M.D.   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  [facility]ed [facility] after cardiac arrest at [facility], then repeat arrest at [facility].  Unable to bag pt through trach in ER so he was intubated orally.  Aprrox 25 mins total CPR.  A new trache was placed on 1/31.   ASSESSMENT / PLAN:  PULMONARY Acute on chronic hypoxemic and hypercapneic respiratory failure 2/2 Mucus plug + HCAP RLL +/- Pulm edema (at [facility]) S/P Trache change on 1/31 Hx COPD   P:   Vent support, ATC as tolerated. Being weaned at [facility].  Looks like the acute injury is mucus plug 2/2 RLL HCAP.  Keep sats > 88% Cont Vanc and zosyn for now.  Hold off on diuresis   CARDIOVASCULAR Cardiac arrest - likely 2/2 mucus plug S/P Refractory shock, off pressors Hx CAD -  Hx HTN  P:  Start lasix IV 40 daily  RENAL No active issue  P:   Will  d/c IVF once on TF  GASTROINTESTINAL No active issue  P:   Start TF.  PPI    HEMATOLOGIC Anemia - likely chronic  P:  F/u cbc  SQ heparin   INFECTIOUS R HCAP 2 episodes of soft stools on 1/31 BUT pt is on miralax and TF at [facility]. (-) WBC cnt, (-) fever P:   Cont Vanc and zosyn for HCAP Cdiff unlikely. Will d/c isolation Pan culture  Trend wbc, fever curve off abx for now   ENDOCRINE DM  P:   SSI   NEUROLOGIC AMS - post cardiac arrest with ~25 mins downtime.  Unclear baseline neuro status but reportedly was neurologically intact after previous arrest.  Appears neuro intact.  P:   RASS goal: 0- -1  CT head (-) EEG > on going   FAMILY  - Updates: Pt is limited code at  No CPR, no ACLS, no defib. Only intubation.    I spent  30  minutes of Critical Care time with this patient today.   Pollie Meyer, MD 07/24/2016, 9:40 AM [facility] Pager [phone] After 3 pm or if no answer, call [phone]

## 2016-07-24 NOTE — Progress Notes (Signed)
Bedside EEG completed, results pending. 

## 2016-07-24 NOTE — Progress Notes (Signed)
PULMONARY / CRITICAL CARE MEDICINE   Name: Tony [name] MRN: [id number] DOB: 20-Apr-1946    ADMISSION DATE:  07/23/2016 CONSULTATION DATE:  07/23/2016  REFERRING MD:  EDP - Dr. Jacqulyn Bath  CHIEF COMPLAINT:  Cardiac arrest and respiratory failure  HISTORY OF PRESENT ILLNESS:   71 year old male with previous cardiac arrest in [facility] that required a trach/peg but evidently was neurologically intact.  Patient cardiac arrested in [facility] with 2 rounds of CPR in [facility] and one round in [place].  They were unable to move air after the third code and decision was made to intubate from above and remove trach.  EDP communicated with sister who is her main decision maker and full code status confirmed.  Patient remained completely unresponsive and in refractory shock post third code and PCCM was called to admit.  SUBJECTIVE:  Off pressors. Awake, follows commands.  Not on sedation. No diarrhea and pt is on miralax at [facility]. No fever.    VITAL SIGNS: BP (!) 106/57   Pulse 90   Temp 99.6 F (37.6 C) (Oral)   Resp 20   Ht 5' 8.9" (1.75 m)   Wt 93.9 kg (207 lb 0.2 oz)   SpO2 (!) 82%   BMI 30.66 kg/m   HEMODYNAMICS:    VENTILATOR SETTINGS: Vent Mode: PRVC FiO2 (%):  [30 %-100 %] 40 % Set Rate:  [20 bmp-24 bmp] 20 bmp Vt Set:  [550 mL-620 mL] 560 mL PEEP:  [5 cmH20] 5 cmH20 Plateau Pressure:  [16 cmH20-24 cmH20] 18 cmH20  INTAKE / OUTPUT: I/O last 3 completed shifts: In: 2284.7 [I.V.:2184.7; IV Piggyback:100] Out: 580 [Urine:580]  PHYSICAL EXAMINATION: General:  Chronically ill appearing male with trach/peg, awake, follows commands Neuro:  CN grossly intact. (-) lateralizing signs HEENT:  [place]/AT, PERLA. (-) NVD Cardiovascular:  RRR, Nl S1/S2, -M/R/G Lungs:  Good ae. (+) trache. Rhonchi Bilaterally Abdomen:  Soft, NT, ND and +BS. (+) PEG tube Musculoskeletal:  -edema and -tenderness Skin:  Intact  LABS:  BMET  Recent Labs Lab 07/23/16 1044 07/23/16 1540 07/23/16 1810  07/24/16 0440  NA 139 135  --  136  K 3.7 3.8  --  3.1*  CL 104 94*  --  100*  CO2 27 27  --  25  BUN 9 16  --  14  CREATININE 0.86 1.26* 1.04 0.92  GLUCOSE 158* 141*  --  165*    Electrolytes  Recent Labs Lab 07/23/16 1044 07/23/16 1540 07/24/16 0440  CALCIUM 7.3* 8.6* 7.5*  MG  --  1.8 1.5*  PHOS  --  5.0* 2.6    CBC  Recent Labs Lab 07/23/16 1540 07/23/16 1810 07/24/16 0440  WBC 21.1* 15.4* 9.7  HGB 9.9* 7.8* 7.0*  HCT 33.0* 25.5* 22.2*  PLT 342 292 236    Coag's  Recent Labs Lab 07/23/16 1540  APTT 26  INR 1.24    Sepsis Markers  Recent Labs Lab 07/23/16 1054  LATICACIDVEN 1.65    ABG  Recent Labs Lab 07/23/16 1156 07/23/16 1309  PHART 7.236* 7.487*  PCO2ART 78.6* 38.0  PO2ART 48.0* 227.0*    Liver Enzymes  Recent Labs Lab 07/23/16 1044 07/23/16 1540  AST 60* 53*  ALT 22 29  ALKPHOS 70 86  BILITOT 0.8 0.8  ALBUMIN 2.0* 2.4*    Cardiac Enzymes No results for input(s): TROPONINI, PROBNP in the last 168 hours.  Glucose  Recent Labs Lab 07/23/16 1440 07/23/16 2041 07/23/16 2334 07/24/16 0406 07/24/16 0729  GLUCAP  161* 146* 130* 155* 129*    Imaging Ct Head Wo Contrast  Result Date: 07/23/2016 CLINICAL DATA:  Found unresponsive today EXAM: CT HEAD WITHOUT CONTRAST TECHNIQUE: Contiguous axial images were obtained from the base of the skull through the vertex without intravenous contrast. COMPARISON:  None. FINDINGS: Brain: The ventricular system is within normal limits in size for age and only minimal prominence of cortical sulci is noted. Bilateral benign-appearing basal ganglial calcifications are present. The septum is midline in position. No hemorrhage, mass lesion, or acute infarction is seen. Vascular: No vascular abnormality is seen on this unenhanced study. Skull: On bone window images, no calvarial abnormality is seen. Sinuses/Orbits: There is a small amount of fluid layering dependently within the sphenoid sinus.  The remainder of the paranasal sinuses are well pneumatized. Other: None. IMPRESSION: 1. No acute intracranial abnormality. 2. Small amount of fluid layers within the sphenoid sinus. Electronically Signed   By: Dwyane Dee M.D.   On: 07/23/2016 14:11   Dg Chest Port 1 View  Result Date: 07/23/2016 CLINICAL DATA:  Acute respiratory failure with hypoxemia.  Post CPR. EXAM: PORTABLE CHEST 1 VIEW COMPARISON:  07/23/2016 FINDINGS: Cardiomediastinal silhouette is unchanged. Pulmonary vascular congestion, interstitial edema, bilateral lower lung airspace disease/atelectasis again noted. An endotracheal tube is present with tip 7 cm above the carina, and right PICC line with tip overlying the mid SVC again noted. A defibrillator pad overlying the chest is present. There is no evidence of pneumothorax. IMPRESSION: Little significant change with interstitial edema and bilateral lower lung atelectasis/airspace disease. Electronically Signed   By: Harmon Pier M.D.   On: 07/23/2016 13:32   Dg Chest Portable 1 View  Result Date: 07/23/2016 CLINICAL DATA:  Recent cardiac arrest EXAM: PORTABLE CHEST 1 VIEW COMPARISON:  None. FINDINGS: Cardiac shadow is within normal limits. Endotracheal tube is noted in satisfactory position just above the aortic knob. Right-sided PICC line is noted in satisfactory position. Diffuse parenchymal changes are noted bilaterally but worst in the right lung base. IMPRESSION: Endotracheal tube in satisfactory position. Scattered parenchymal densities most prominent in the right lower lobe likely related to acute infiltrate. Electronically Signed   By: Alcide Clever M.D.   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  Presented to [facility] 1/31 after cardiac arrest at [facility], then repeat arrest at [facility].  Unable to bag pt through trach in ER so he was intubated orally.  Aprrox 25 mins total CPR.  A new trache was placed on 1/31.   ASSESSMENT / PLAN:  PULMONARY Acute on chronic hypoxemic and hypercapneic respiratory failure 2/2 Mucus plug + HCAP RLL +/- Pulm edema (at [facility]) S/P Trache change on 1/31 Hx COPD   P:   Vent support, ATC as tolerated. Being weaned at [facility].  Looks like the acute injury is mucus plug 2/2 RLL HCAP.  Keep sats > 88% Cont Vanc and zosyn for now.  Hold off on diuresis   CARDIOVASCULAR Cardiac arrest - likely 2/2 mucus plug S/P Refractory shock, off pressors Hx CAD -  Hx HTN  P:  Start lasix IV 40 daily  RENAL No active issue  P:  Will d/c IVF once on TF  GASTROINTESTINAL No active issue  P:   Start TF.  PPI    HEMATOLOGIC Anemia - likely chronic  P:  F/u cbc  SQ heparin   INFECTIOUS R HCAP 2 episodes of soft stools on 1/31 BUT pt is on miralax and TF at [facility]. (-) WBC cnt, (-) fever P:   Cont Vanc and zosyn for HCAP Cdiff unlikely. Will d/c isolation Pan culture  Trend wbc, fever curve off abx for now   ENDOCRINE DM  P:   SSI   NEUROLOGIC AMS - post cardiac arrest with ~25 mins downtime.  Unclear baseline neuro status but reportedly was neurologically intact after previous arrest.  Appears neuro intact.  P:   RASS goal: 0- -1  CT head (-) EEG > on going   FAMILY  - Updates: Pt is limited code at  No CPR, no ACLS, no defib. Only intubation.    I spent  30  minutes of Critical Care time with this patient today.   Pollie MeyerJ. Angelo A de Dios, MD 07/24/2016, 9:41 AM [place] [facility] Pager [phone] After 3 pm or if no answer, call [phone]

## 2016-07-25 ENCOUNTER — Inpatient Hospital Stay (HOSPITAL_COMMUNITY): Payer: Medicare Other

## 2016-07-25 DIAGNOSIS — J189 Pneumonia, unspecified organism: Secondary | ICD-10-CM

## 2016-07-25 LAB — BASIC METABOLIC PANEL
ANION GAP: 9 (ref 5–15)
Anion gap: 7 (ref 5–15)
BUN: 19 mg/dL (ref 6–20)
BUN: 16 mg/dL (ref 6–20)
CHLORIDE: 104 mmol/L (ref 101–111)
CHLORIDE: 106 mmol/L (ref 101–111)
CO2: 27 mmol/L (ref 22–32)
CO2: 29 mmol/L (ref 22–32)
Calcium: 7.9 mg/dL — ABNORMAL LOW (ref 8.9–10.3)
Calcium: 8.2 mg/dL — ABNORMAL LOW (ref 8.9–10.3)
Creatinine, Ser: 0.83 mg/dL (ref 0.61–1.24)
Creatinine, Ser: 0.84 mg/dL (ref 0.61–1.24)
GFR calc Af Amer: >60 mL/min (ref >60)
GFR calc Af Amer: >60 mL/min (ref >60)
GFR calc non Af Amer: >60 mL/min (ref >60)
GFR calc non Af Amer: >60 mL/min (ref >60)
GLUCOSE: 119 mg/dL — AB (ref 65–99)
Glucose, Bld: 110 mg/dL — ABNORMAL HIGH (ref 65–99)
POTASSIUM: 2.9 mmol/L — AB (ref 3.5–5.1)
POTASSIUM: 3.9 mmol/L (ref 3.5–5.1)
SODIUM: 142 mmol/L (ref 135–145)
Sodium: 140 mmol/L (ref 135–145)

## 2016-07-25 LAB — CBC
HEMATOCRIT: 22.4 % — AB (ref 39.0–52.0)
HEMOGLOBIN: 7.1 g/dL — AB (ref 13.0–17.0)
MCH: 28.4 pg (ref 26.0–34.0)
MCHC: 31.7 g/dL (ref 30.0–36.0)
MCV: 89.6 fL (ref 78.0–100.0)
PLATELETS: 207 10*3/uL (ref 150–400)
RBC: 2.5 MIL/uL — AB (ref 4.22–5.81)
RDW: 15.6 % — ABNORMAL HIGH (ref 11.5–15.5)
WBC: 9.8 10*3/uL (ref 4.0–10.5)

## 2016-07-25 LAB — GLUCOSE, CAPILLARY
GLUCOSE-CAPILLARY: 104 mg/dL — AB (ref 65–99)
GLUCOSE-CAPILLARY: 113 mg/dL — AB (ref 65–99)
GLUCOSE-CAPILLARY: 89 mg/dL (ref 65–99)
Glucose-Capillary: 123 mg/dL — ABNORMAL HIGH (ref 65–99)
Glucose-Capillary: 116 mg/dL — ABNORMAL HIGH (ref 65–99)
Glucose-Capillary: 123 mg/dL — ABNORMAL HIGH (ref 65–99)

## 2016-07-25 LAB — PREPARE RBC (CROSSMATCH)

## 2016-07-25 LAB — PHOSPHORUS
PHOSPHORUS: 3.3 mg/dL (ref 2.5–4.6)
Phosphorus: 1.8 mg/dL — ABNORMAL LOW (ref 2.5–4.6)

## 2016-07-25 LAB — ABO/RH: ABO/RH(D): O POS

## 2016-07-25 LAB — MAGNESIUM
MAGNESIUM: 1.9 mg/dL (ref 1.7–2.4)
Magnesium: 1.9 mg/dL (ref 1.7–2.4)

## 2016-07-25 MED ORDER — PANTOPRAZOLE SODIUM 40 MG PO PACK
40.0000 mg | PACK | Freq: Every day | ORAL | Status: DC
  Administered 2016-07-25 – 2016-07-30 (×6): 40 mg
  Filled 2016-07-25 (×6): qty 20

## 2016-07-25 MED ORDER — POTASSIUM PHOSPHATES 15 MMOLE/5ML IV SOLN
30.0000 mmol | Freq: Once | INTRAVENOUS | Status: AC
  Administered 2016-07-25: 30 mmol via INTRAVENOUS
  Filled 2016-07-25: qty 10

## 2016-07-25 MED ORDER — FUROSEMIDE 10 MG/ML IJ SOLN
40.0000 mg | Freq: Once | INTRAMUSCULAR | Status: AC
  Administered 2016-07-25: 40 mg via INTRAVENOUS
  Filled 2016-07-25: qty 4

## 2016-07-25 MED ORDER — POTASSIUM CHLORIDE 20 MEQ/15ML (10%) PO SOLN
40.0000 meq | ORAL | Status: AC
  Administered 2016-07-25 (×2): 40 meq
  Filled 2016-07-25 (×3): qty 30

## 2016-07-25 MED ORDER — POTASSIUM CHLORIDE 20 MEQ/15ML (10%) PO SOLN: 40.0000 meq | ORAL | Status: DC

## 2016-07-25 MED ORDER — SODIUM CHLORIDE 0.9 % IV SOLN: Freq: Once | INTRAVENOUS | Status: AC

## 2016-07-25 MED ORDER — FUROSEMIDE 40 MG PO TABS
40.0000 mg | ORAL_TABLET | Freq: Every day | ORAL | Status: DC
  Administered 2016-07-26 – 2016-07-30 (×5): 40 mg via ORAL
  Filled 2016-07-25 (×5): qty 1

## 2016-07-25 NOTE — Progress Notes (Signed)
Progress Note  Patient Name: Tony [name] Date of Encounter: 07/25/2016  Primary Cardiologist: New, Dr. Meda Coffee  Subjective   Pt states he is not having chest pain or palpitations. Awake, alert, and answers questions appropriately.  Inpatient Medications    Scheduled Meds: . sodium chloride   Intravenous Once  . albuterol  2.5 mg Nebulization Q4H  . albuterol  5 mg Nebulization Once  . chlorhexidine gluconate (MEDLINE KIT)  15 mL Mouth Rinse BID  . feeding supplement (PRO-STAT SUGAR FREE 64)  30 mL Per Tube QID  . feeding supplement (VITAL HIGH PROTEIN)  1,000 mL Per Tube Q24H  . [START ON 07/26/2016] furosemide  40 mg Oral Daily  . heparin  5,000 Units Subcutaneous Q8H  . insulin aspart  0-15 Units Subcutaneous Q4H  . mouth rinse  15 mL Mouth Rinse QID  . pantoprazole (PROTONIX) IV  40 mg Intravenous Q24H  . pantoprazole sodium  40 mg Per Tube Daily  . potassium chloride  40 mEq Per Tube Q4H  . potassium phosphate IVPB (mmol)  30 mmol Intravenous Once  . vancomycin  750 mg Intravenous Q12H   Continuous Infusions:  PRN Meds: HYDROmorphone (DILAUDID) injection, midazolam, midazolam, naphazoline-glycerin   Vital Signs    Vitals:   07/25/16 0734 07/25/16 0802 07/25/16 0813 07/25/16 0857  BP:      Pulse:   92 (!) 106  Resp:   (!) 30 (!) 34  Temp: 98.7 F (37.1 C)     TempSrc: Oral     SpO2:  98% 95% 91%  Weight:      Height:        Intake/Output Summary (Last 24 hours) at 07/25/16 1011 Last data filed at 07/25/16 0600  Gross per 24 hour  Intake             3255 ml  Output             1275 ml  Net             1980 ml   Filed Weights   07/23/16 1800 07/24/16 0500 07/25/16 0157  Weight: 207 lb 0.2 oz (93.9 kg) 207 lb 0.2 oz (93.9 kg) 212 lb 8.4 oz (96.4 kg)    Telemetry    NSR in the 90s - Personally Reviewed  ECG    No new tracings  Physical Exam   General: Well developed, well nourished, male appearing in no acute distress. Head: Normocephalic,  atraumatic.  Neck: Supple without bruits, JVD difficult to assess. Lungs:  Resp regular and unlabored, scattered crackles in B bases. Heart: RRR, S1, S2, no murmur; no rub. Abdomen: Soft, non-tender, non-distended with normoactive bowel sounds. No hepatomegaly. No rebound/guarding. No obvious abdominal masses. Extremities: No clubbing, cyanosis, Trace edema. Distal pedal pulses are 1+ bilaterally. Neuro: Alert and oriented X 3. Moves all extremities spontaneously. Psych: Normal affect.  Labs    Chemistry Recent Labs Lab 07/23/16 1044 07/23/16 1540  07/24/16 0440 07/24/16 1610 07/25/16 0449  NA 139 135  --  136 138 140  K 3.7 3.8  --  3.1* 3.3* 2.9*  CL 104 94*  --  100* 102 104  CO2 27 27  --  25 29 27   GLUCOSE 158* 141*  --  165* 121* 119*  BUN 9 16  --  14 18 19   CREATININE 0.86 1.26*  < > 0.92 0.99 0.83  CALCIUM 7.3* 8.6*  --  7.5* 8.2* 7.9*  PROT 6.4* 7.7  --   --   --   --  ALBUMIN 2.0* 2.4*  --   --   --   --   AST 60* 53*  --   --   --   --   ALT 22 29  --   --   --   --   ALKPHOS 70 86  --   --   --   --   BILITOT 0.8 0.8  --   --   --   --   GFRNONAA >60 57*  < > >60 >60 >60  GFRAA >60 >60  < > >60 >60 >60  ANIONGAP 8 14  --  11 7 9   < > = values in this interval not displayed.   Hematology Recent Labs Lab 07/23/16 1810 07/24/16 0440 07/25/16 0449  WBC 15.4* 9.7 9.8  RBC 2.86* 2.54* 2.50*  HGB 7.8* 7.0* 7.1*  HCT 25.5* 22.2* 22.4*  MCV 89.2 87.4 89.6  MCH 27.3 27.6 28.4  MCHC 30.6 31.5 31.7  RDW 15.3 15.4 15.6*  PLT 292 236 207    Cardiac EnzymesNo results for input(s): TROPONINI in the last 168 hours.  Recent Labs Lab 07/23/16 1052  TROPIPOC 0.04     BNPNo results for input(s): BNP, PROBNP in the last 168 hours.   DDimer No results for input(s): DDIMER in the last 168 hours.   Radiology    Ct Head Wo Contrast  Result Date: 07/23/2016 CLINICAL DATA:  Found unresponsive today EXAM: CT HEAD WITHOUT CONTRAST TECHNIQUE: Contiguous axial  images were obtained from the base of the skull through the vertex without intravenous contrast. COMPARISON:  None. FINDINGS: Brain: The ventricular system is within normal limits in size for age and only minimal prominence of cortical sulci is noted. Bilateral benign-appearing basal ganglial calcifications are present. The septum is midline in position. No hemorrhage, mass lesion, or acute infarction is seen. Vascular: No vascular abnormality is seen on this unenhanced study. Skull: On bone window images, no calvarial abnormality is seen. Sinuses/Orbits: There is a small amount of fluid layering dependently within the sphenoid sinus. The remainder of the paranasal sinuses are well pneumatized. Other: None. IMPRESSION: 1. No acute intracranial abnormality. 2. Small amount of fluid layers within the sphenoid sinus. Electronically Signed   By: Ivar Drape M.D.   On: 07/23/2016 14:11   Dg Chest Port 1 View  Result Date: 07/25/2016 CLINICAL DATA:  Cardiac arrest pneumonia. EXAM: PORTABLE CHEST 1 VIEW COMPARISON:  07/23/2016. FINDINGS: Tracheostomy tube in stable position. Right PICC line tip noted the right atrium. Cardiomegaly with mild bilateral interstitial prominence and bilateral pleural effusions consistent with congestive heart failure . Bilateral pneumonia cannot be excluded. No pneumothorax. IMPRESSION: 1. Tracheostomy tube in stable position. Right PICC line noted with tip in the right atrium. 2. Cardiomegaly with bilateral from interstitial prominence and bilateral pleural effusions consistent congestive heart failure. Bilateral pneumonia cannot be excluded. Electronically Signed   By: Marcello Moores  Register   On: 07/25/2016 06:48   Dg Chest Port 1 View  Result Date: 07/23/2016 CLINICAL DATA:  Acute respiratory failure with hypoxemia.  Post CPR. EXAM: PORTABLE CHEST 1 VIEW COMPARISON:  07/23/2016 FINDINGS: Cardiomediastinal silhouette is unchanged. Pulmonary vascular congestion, interstitial edema, bilateral  lower lung airspace disease/atelectasis again noted. An endotracheal tube is present with tip 7 cm above the carina, and right PICC line with tip overlying the mid SVC again noted. A defibrillator pad overlying the chest is present. There is no evidence of pneumothorax. IMPRESSION: Little significant change with interstitial edema and bilateral  lower lung atelectasis/airspace disease. Electronically Signed   By: Margarette Canada M.D.   On: 07/23/2016 13:32   Dg Chest Portable 1 View  Result Date: 07/23/2016 CLINICAL DATA:  Recent cardiac arrest EXAM: PORTABLE CHEST 1 VIEW COMPARISON:  None. FINDINGS: Cardiac shadow is within normal limits. Endotracheal tube is noted in satisfactory position just above the aortic knob. Right-sided PICC line is noted in satisfactory position. Diffuse parenchymal changes are noted bilaterally but worst in the right lung base. IMPRESSION: Endotracheal tube in satisfactory position. Scattered parenchymal densities most prominent in the right lower lobe likely related to acute infiltrate. Electronically Signed   By: Inez Catalina M.D.   On: 07/23/2016 11:18    Cardiac Studies   Echocardiogram 07/23/16: Study Conclusions - Left ventricle: The cavity size was normal. Wall thickness was   increased in a pattern of mild LVH.  Impressions: - The echo is technically not adequate enough to read.   Suggest repeat study once patient has improved.  Patient Profile     71 y.o. male with a history of CAD, PVD, HTN, HLD, aflutter s/p ablation, COPD, DM, current tobacco abuse and untreated sleep apnea who presents with cardiac arrest that was likely secondary to respiratory failure (PNA).  Assessment & Plan    1. Cardiac arrest x 3 on 07/23/16 - cardiac arrest appeared PEA and was likely secondary to respiratory status, per ED provider - EKG with nonspecific TWI. - will review repeat EKG with attending - echo here with normal LVEF - CT head without acute intracranial  abnormality - off of pressors, maintaining NSR per telemetry - pt denied chest pain at outside facility before cardiac arrest; he reports chest discomfort that is secondary to aggressive NT suctioning; he currently denies chest pain and palpitations, but reports some discomfort associated with 3 rounds of compressions - will review with attending and continue to follow  2. CAD s/p BMS to RCA in 2008 - last myoview on 06/2015 showed no ischemia, but impaired systolic function - no further ischemic workup warranted at this time, will discuss with MD  3. Respiratory failure 2/2 mucus plug and HCAP - intubated in ER in conjunction with cardiac arrest -  MRSA in sputum - ABX for PNA - extubated and back on trach/ventilator - WBC trending down  4. Hypokalemia - K replacement per primary team - keep K above 3.5  Signed, Minette Brine , PA-C 10:11 AM 07/25/2016   The patient was seen, examined and discussed with Minette Brine , PA-C and I agree with the above.   71 y.o. male with a history of CAD, s/p PCI to RCA in 2008, PVD, HTN, HLD, aflutter s/p ablation, COPD on chronic O2 at home, DM, TEE in 2/17 showed LVEF 40-45%. Stress test in 1/17 showed prior inferior infarct and no ischemia.  Admitted 05/27/17-06/19/17 @ [facility] post cardiac arrest, NSTEMI and empirically treated for HCAP. Echo showed normal LV function. She required trach/peg and discharged to long term care facility [facility] where he again had cardiac arrest x 2 today requiring CPR, per ER physician it was PEA arrest with quick recovery with epinephrine and short CPR. In ER again brady- PEA seemed to be related to respiratory failure, CPR for 4 minutes. PT intubated and trach was removed. EKG showed sinus tachycardia at rate of 122 bpm with non specific TWI. POC troponin is 0.04.   The patient has neurologically recovered from the two arrests, there is no evidence that those are cardiac  but seem to be related to underlying respiratory  failure and chronic severe COPD requiring 24/7 prior to the cardiac arrest. He has preserved LVEF and negative troponin, we are not planning an ischemic workup and will sign off, call us with any questions.   Ena Dawley, MD 07/25/2016

## 2016-07-25 NOTE — Progress Notes (Signed)
Pt desat to 84% on 40 % ATC.  Increased fio2 to 50%, sat now 92%.  RN at bedside and aware.

## 2016-07-25 NOTE — Progress Notes (Signed)
[facility]OC[facility] FOR AM LAB REPLACEMENT ONLY  The patient does apply for the [facility] Electro[facility] Protocol based on the criteria listed below:   1. Is GFR >/= 40 ml/min? Yes.    Patient's GFR today is >60 2. Is urine output >/= 0.5 ml/kg/hr for the last 6 hours? Yes.   Patient's UOP is 0.70 ml/kg/hr 3. Is BUN < 60 mg/dL? Yes.    Patient's BUN today is 19 4. Abnormal electrolyte(s):Potassium 2.9 5. Ordered repletion with: Potassium per protocol 6. If a panic level lab has been reported, has the CCM MD in charge been notified? Yes.  .   Physician:  Arvil Personseterding, E  Linkyn Gobin P 07/25/2016 6:12 AM

## 2016-07-25 NOTE — Progress Notes (Signed)
PULMONARY / CRITICAL CARE MEDICINE   Name: Tony [name] MRN: [id number] DOB: [birth year]    ADMISSION DATE:  07/23/2016 CONSULTATION DATE:  07/23/2016  REFERRING MD:  EDP - Dr. Jacqulyn Bath  CHIEF COMPLAINT:  Cardiac arrest and respiratory failure  HISTORY OF PRESENT ILLNESS:   71 year old male with previous cardiac arrest in [facility] that required a trach/peg but evidently was neurologically intact.  Patient cardiac arrested in [facility] with 2 rounds of CPR in [facility] and one round in [place].  They were unable to move air after the third code and decision was made to intubate from above and remove trach.  EDP communicated with sister who is his main decision maker and full code status confirmed.  Patient remained completely unresponsive and in refractory shock post third code and PCCM was called to admit.  SUBJECTIVE:  Has been afebrile  Maintaining blood pressure off pressures  Volume up  Noted to be hypokalemic overnight   VITAL SIGNS: BP 125/74   Pulse 98   Temp 98.6 F (37 C) (Oral)   Resp (!) 21   Ht 5' 8.9" (1.75 m)   Wt 212 lb 8.4 oz (96.4 kg)   SpO2 100%   BMI 31.48 kg/m   HEMODYNAMICS:    VENTILATOR SETTINGS: Vent Mode: PRVC FiO2 (%):  [30 %-40 %] 40 % Set Rate:  [20 bmp] 20 bmp Vt Set:  [560 mL] 560 mL PEEP:  [5 cmH20] 5 cmH20 Plateau Pressure:  [17 cmH20-19 cmH20] 19 cmH20  INTAKE / OUTPUT: I/O last 3 completed shifts: In: 3539.7 [I.V.:2444.7; NG/GT:280; IV Piggyback:815] Out: 1180 [Urine:1180]  PHYSICAL EXAMINATION: General:  Chronically ill appearing male with trach/peg, awake, follows commands Neuro:  CN grossly intact. HEENT:  [place]/AT, PERRLA.  Cardiovascular:  RRR, Nl S1/S2, -M/R/G Lungs:  Good ae. (+) trache. Rhonchi Bilaterally Abdomen:  Soft, NT, ND and +BS. (+) PEG tube Musculoskeletal:  -edema and -tenderness Skin:  Intact  LABS:  BMET  Recent Labs Lab 07/24/16 0440 07/24/16 1610 07/25/16 0449  NA 136 138 140  K 3.1* 3.3* 2.9*  CL 100* 102 104   CO2 25 29 27   BUN 14 18 19   CREATININE 0.92 0.99 0.83  GLUCOSE 165* 121* 119*    Electrolytes  Recent Labs Lab 07/24/16 0440 07/24/16 0944 07/24/16 1610 07/25/16 0449  CALCIUM 7.5*  --  8.2* 7.9*  MG 1.5* 2.0 2.1 1.9  PHOS 2.6 2.4* 2.1* 1.8*    CBC  Recent Labs Lab 07/23/16 1810 07/24/16 0440 07/25/16 0449  WBC 15.4* 9.7 9.8  HGB 7.8* 7.0* 7.1*  HCT 25.5* 22.2* 22.4*  PLT 292 236 207    Coag's  Recent Labs Lab 07/23/16 1540  APTT 26  INR 1.24    Sepsis Markers  Recent Labs Lab 07/23/16 1054  LATICACIDVEN 1.65    ABG  Recent Labs Lab 07/23/16 1156 07/23/16 1309  PHART 7.236* 7.487*  PCO2ART 78.6* 38.0  PO2ART 48.0* 227.0*    Liver Enzymes  Recent Labs Lab 07/23/16 1044 07/23/16 1540  AST 60* 53*  ALT 22 29  ALKPHOS 70 86  BILITOT 0.8 0.8  ALBUMIN 2.0* 2.4*    Cardiac Enzymes No results for input(s): TROPONINI, PROBNP in the last 168 hours.  Glucose  Recent Labs Lab 07/24/16 0729 07/24/16 1120 07/24/16 1515 07/24/16 1937 07/24/16 2333 07/25/16 0331  GLUCAP 129* 130* 115* 105* 104* 113*    Imaging No results found.   STUDIES:  CT head 1/31>>> (-) EEG 1/31>>> (-)  CULTURES: Blood 1/31>>> No growth  Urine 1/31>>> No growth  Sputum 1/31>>> No Growth  ANTIBIOTICS: Vancomycin 1/31>>> 07/25/16 Zosyn 1/31>>>07/25/16 Cefepime 07/25/2016>>  SIGNIFICANT EVENTS:   LINES/TUBES: ETT 1/31>>> Perc re-do trach 1/31>>> PICC RUE (pta)>>>   DISCUSSION: 71yo male with complex PMH [facility], now at [facility] trach/PEG dependent.  [facility], [facility]  Unable to bag pt through trach in ER so he was intubated orally.  Aprrox 25 mins total CPR.  A new trache was placed on 1/31.   ASSESSMENT / PLAN:  PULMONARY Acute on chronic hypoxemic and hypercapneic respiratory failure 2/2 Mucus plug + HCAP RLL +/- Pulm  edema (at [facility]) S/P Trache change on 1/31 Hx COPD   P:   Vent support, ATC as tolerated. Keep sats > 88% D/C  Vancomycin and zosyn  Consider transitioning to Cefepime   CARDIOVASCULAR Cardiac arrest - likely 2/2 mucus plug S/P Refractory shock, off pressors Hx CAD -  Hx HTN  P:  Output  1.2 L+ , weight up 212 lbs from 207 lbs  Start lasix IV 40 daily  RENAL No active issue  P:   Hypokalemia >> Replete K x 40 meq BID per tube   D/C IVF today   GASTROINTESTINAL No active issue  P:   D/C NPO  Continue TF  PPI   HEMATOLOGIC Anemia - likely chronic  P:  No elevated WBC count  Hgb 7.1 Transfuse 1 unit of pRBCs SQ heparin   INFECTIOUS R HCAP 2 episodes of soft stools on 1/31 BUT pt is on miralax and TF at [facility]. (-) WBC cnt, (-) fever P:   D/C Vanc and Zosyn Consider transition to Cefepime  Cultures negative x 48 hrs Continue to trend wbc and fever curve   ENDOCRINE DM  P:   SSI   NEUROLOGIC AMS - post cardiac arrest with ~25 mins downtime.  Unclear baseline neuro status but reportedly was neurologically intact after previous arrest.  Appears neuro intact.  P:   RASS goal: 0- -1  CT head (-) EEG (-)   FAMILY  - Updates: Pt is limited code at  No CPR, no ACLS, no defib. Only intubation.   ATTENDING NOTE / ATTESTATION NOTE :   I have discussed the case with the resident/APP  Dr. Cathlean CowerMikell.   I agree with the resident/APP's  history, physical examination, assessment, and plans.    I have edited the above note and modified it according to our agreed history, physical examination, assessment and plan.   Briefly, 71 year old male with previous cardiac arrest in [facility] that required a trach/peg but evidently was neurologically intact.  Patient cardiac arrested in [facility] with 2 rounds of CPR in [facility] and one round in [place].  [facility] and [facility].  He plugged his trache which was changed.  He was on pressors and they were weaned off.     Last 1-2 days, pt has been at baseline.  Off pressors. Awake, following commands. Appears neurologically intact. On ATC now, looks comfortable.   Vitals:  Vitals:   07/25/16 0700 07/25/16 0734 07/25/16 0802 07/25/16 0813  BP: 122/68     Pulse: 74   92  Resp: 20   (!) 30  Temp:  98.7 F (37.1 C)    TempSrc:  Oral    SpO2: 96%  98% 95%  Weight:  Height:        Constitutional/General: well-nourished, well-developed, comfortable on ATC.   Body mass index is 31.48 kg/m. Wt Readings from Last 3 Encounters:  07/25/16 96.4 kg (212 lb 8.4 oz)    HEENT: PERLA, anicteric sclerae. (-) Oral thrush. Trache in place. On ATC.   Neck: No masses. Midline trachea. No JVD, (-) LAD. (-) bruits appreciated.  Respiratory/Chest: Grossly normal chest. (-) deformity. (-) Accessory muscle use.  Symmetric expansion. Diminished BS on both lower lung zones. (-) wheezing,  Rhonchi Rhonchi at bases.  (-) egophony  Cardiovascular: Regular rate and  rhythm, heart sounds normal, no murmur or gallops,  Trace peripheral edema  Gastrointestinal:  Normal bowel sounds. Soft, non-tender. No hepatosplenomegaly.  (-) masses.   Musculoskeletal:  Normal muscle tone.   Extremities: Grossly normal. (-) clubbing, cyanosis.  (-) edema  Skin: (-) rash,lesions seen.   Neurological/Psychiatric :  CN grossly intact. (-) lateralizing signs. Appears neurologically intact.    CBC Recent Labs     07/23/16  1810  07/24/16  0440  07/25/16  0449  WBC  15.4*  9.7  9.8  HGB  7.8*  7.0*  7.1*  HCT  25.5*  22.2*  22.4*  PLT  292  236  207    Coag's Recent Labs     07/23/16  1540  APTT  26  INR  1.24    BMET Recent Labs     07/24/16  0440  07/24/16  1610  07/25/16  0449  NA  136  138  140  K  3.1*  3.3*  2.9*  CL  100*  102  104  CO2  25  29  27   BUN  14  18  19   CREATININE  0.92  0.99  0.83  GLUCOSE  165*  121*  119*    Electrolytes Recent Labs     07/24/16  0440  07/24/16  0944   07/24/16  1610  07/25/16  0449  CALCIUM  7.5*   --   8.2*  7.9*  MG  1.5*  2.0  2.1  1.9  PHOS  2.6  2.4*  2.1*  1.8*    Sepsis Markers No results for input(s): PROCALCITON, O2SATVEN in the last 72 hours.  Invalid input(s): LACTICACIDVEN  ABG Recent Labs     07/23/16  1156  07/23/16  1309  PHART  7.236*  7.487*  PCO2ART  78.6*  38.0  PO2ART  48.0*  227.0*    Liver Enzymes Recent Labs     07/23/16  1044  07/23/16  1540  AST  60*  53*  ALT  22  29  ALKPHOS  70  86  BILITOT  0.8  0.8  ALBUMIN  2.0*  2.4*    Cardiac Enzymes No results for input(s): TROPONINI, PROBNP in the last 72 hours.  Glucose Recent Labs     07/24/16  0729  07/24/16  1120  07/24/16  1515  07/24/16  1937  07/24/16  2333  07/25/16  0331  GLUCAP  129*  130*  115*  105*  104*  113*    Imaging Ct Head Wo Contrast  Result Date: 07/23/2016 CLINICAL DATA:  Found unresponsive today EXAM: CT HEAD WITHOUT CONTRAST TECHNIQUE: Contiguous axial images were obtained from the base of the skull through the vertex without intravenous contrast. COMPARISON:  None. FINDINGS: Brain: The ventricular system is within normal limits in size for age and only minimal prominence of cortical sulci is  noted. Bilateral benign-appearing basal ganglial calcifications are present. The septum is midline in position. No hemorrhage, mass lesion, or acute infarction is seen. Vascular: No vascular abnormality is seen on this unenhanced study. Skull: On bone window images, no calvarial abnormality is seen. Sinuses/Orbits: There is a small amount of fluid layering dependently within the sphenoid sinus. The remainder of the paranasal sinuses are well pneumatized. Other: None. IMPRESSION: 1. No acute intracranial abnormality. 2. Small amount of fluid layers within the sphenoid sinus. Electronically Signed   By: Dwyane Dee M.D.   On: 07/23/2016 14:11   Dg Chest Port 1 View  Result Date: 07/25/2016 CLINICAL DATA:  Cardiac arrest  pneumonia. EXAM: PORTABLE CHEST 1 VIEW COMPARISON:  07/23/2016. FINDINGS: Tracheostomy tube in stable position. Right PICC line tip noted the right atrium. Cardiomegaly with mild bilateral interstitial prominence and bilateral pleural effusions consistent with congestive heart failure . Bilateral pneumonia cannot be excluded. No pneumothorax. IMPRESSION: 1. Tracheostomy tube in stable position. Right PICC line noted with tip in the right atrium. 2. Cardiomegaly with bilateral from interstitial prominence and bilateral pleural effusions consistent congestive heart failure. Bilateral pneumonia cannot be excluded. Electronically Signed   By: Maisie Fus  Register   On: 07/25/2016 06:48   Dg Chest Port 1 View  Result Date: 07/23/2016 CLINICAL DATA:  Acute respiratory failure with hypoxemia.  Post CPR. EXAM: PORTABLE CHEST 1 VIEW COMPARISON:  07/23/2016 FINDINGS: Cardiomediastinal silhouette is unchanged. Pulmonary vascular congestion, interstitial edema, bilateral lower lung airspace disease/atelectasis again noted. An endotracheal tube is present with tip 7 cm above the carina, and right PICC line with tip overlying the mid SVC again noted. A defibrillator pad overlying the chest is present. There is no evidence of pneumothorax. IMPRESSION: Little significant change with interstitial edema and bilateral lower lung atelectasis/airspace disease. Electronically Signed   By: Harmon Pier M.D.   On: 07/23/2016 13:32   Dg Chest Portable 1 View  Result Date: 07/23/2016 CLINICAL DATA:  Recent cardiac arrest EXAM: PORTABLE CHEST 1 VIEW COMPARISON:  None. FINDINGS: Cardiac shadow is within normal limits. Endotracheal tube is noted in satisfactory position just above the aortic knob. Right-sided PICC line is noted in satisfactory position. Diffuse parenchymal changes are noted bilaterally but worst in the right lung base. IMPRESSION: Endotracheal tube in satisfactory position. Scattered parenchymal densities most prominent in  the right lower lobe likely related to acute infiltrate. Electronically Signed   By: Alcide Clever M.D.   On: 07/23/2016 11:18   Assessment/Plan: 1. S/P cardiac arrest 2/2 mucus plug.  Off pressors. At baseline.  Appears neurologically intact.   2. Acute on Chronic hypoxemic hypercapneic resp fx 2/2 Mucus Plug + HCAP RLL with Staph in sputum + Pulm edema. - ATC as tolerated. He was doing weaning trials at [facility].  - vent prn - cont Vanc; deescalate once with sensitivities. Will d/c zosyn - cont IV lasix x 1 day then switch to PO in am  3. Anemia - transfuse 1 upRBC  4. On heparin SQ for DVT prophylaxis 5. Cont TF   Transfer to SDU vent bed.  Will transfer to [facility]. TRH as primary on 2/3 and PCCM to follow for vent. Dr. Isidoro Donning aware of pt.   I spent  30 minutes of Critical Care time with this patient today. This is my time spent independent of the APP or resident.   Family :   No family at bedside.    Pollie Meyer, MD 07/25/2016, 8:28 AM [facility] Pager [phone] After 3 pm or if no answer, call [phone]

## 2016-07-25 NOTE — Progress Notes (Signed)
Pt placed back on vent at this time due to increased wob, hr, and desats. Will attempt trach collar again later. RT will continue to monitor.

## 2016-07-26 DIAGNOSIS — R092 Respiratory arrest: Secondary | ICD-10-CM

## 2016-07-26 LAB — BASIC METABOLIC PANEL
Anion gap: 7 (ref 5–15)
BUN: 17 mg/dL (ref 6–20)
CHLORIDE: 106 mmol/L (ref 101–111)
CO2: 28 mmol/L (ref 22–32)
CREATININE: 0.78 mg/dL (ref 0.61–1.24)
Calcium: 8.4 mg/dL — ABNORMAL LOW (ref 8.9–10.3)
GFR calc non Af Amer: >60 mL/min (ref >60)
GLUCOSE: 110 mg/dL — AB (ref 65–99)
Potassium: 3.3 mmol/L — ABNORMAL LOW (ref 3.5–5.1)
Sodium: 141 mmol/L (ref 135–145)

## 2016-07-26 LAB — CULTURE, RESPIRATORY

## 2016-07-26 LAB — GLUCOSE, CAPILLARY
GLUCOSE-CAPILLARY: 110 mg/dL — AB (ref 65–99)
GLUCOSE-CAPILLARY: 103 mg/dL — AB (ref 65–99)
Glucose-Capillary: 107 mg/dL — ABNORMAL HIGH (ref 65–99)
Glucose-Capillary: 107 mg/dL — ABNORMAL HIGH (ref 65–99)
Glucose-Capillary: 108 mg/dL — ABNORMAL HIGH (ref 65–99)
Glucose-Capillary: 98 mg/dL (ref 65–99)

## 2016-07-26 LAB — TYPE AND SCREEN
BLOOD PRODUCT EXPIRATION DATE: 201802242359
ISSUE DATE / TIME: 201802021615
UNIT TYPE AND RH: 5100

## 2016-07-26 LAB — VANCOMYCIN, TROUGH: Vancomycin Tr: 14 ug/mL — ABNORMAL LOW (ref 15–20)

## 2016-07-26 LAB — CBC
HEMATOCRIT: 26 % — AB (ref 39.0–52.0)
HEMOGLOBIN: 8 g/dL — AB (ref 13.0–17.0)
MCH: 27.8 pg (ref 26.0–34.0)
MCHC: 30.8 g/dL (ref 30.0–36.0)
MCV: 90.3 fL (ref 78.0–100.0)
Platelets: 251 10*3/uL (ref 150–400)
RBC: 2.88 MIL/uL — ABNORMAL LOW (ref 4.22–5.81)
RDW: 15.4 % (ref 11.5–15.5)
WBC: 11.2 10*3/uL — ABNORMAL HIGH (ref 4.0–10.5)

## 2016-07-26 LAB — CULTURE, RESPIRATORY W GRAM STAIN

## 2016-07-26 MED ORDER — ASPIRIN 81 MG PO CHEW
81.0000 mg | CHEWABLE_TABLET | Freq: Every day | ORAL | Status: DC
  Administered 2016-07-26 – 2016-07-30 (×5): 81 mg
  Filled 2016-07-26 (×5): qty 1

## 2016-07-26 MED ORDER — VANCOMYCIN HCL IN DEXTROSE 1-5 GM/200ML-% IV SOLN
1000.0000 mg | Freq: Two times a day (BID) | INTRAVENOUS | Status: DC
  Administered 2016-07-27 – 2016-07-28 (×4): 1000 mg via INTRAVENOUS
  Filled 2016-07-26 (×4): qty 200

## 2016-07-26 MED ORDER — LEVOTHYROXINE SODIUM 25 MCG PO TABS
125.0000 ug | ORAL_TABLET | Freq: Every day | ORAL | Status: DC
  Administered 2016-07-27 – 2016-07-30 (×4): 125 ug
  Filled 2016-07-26 (×4): qty 1

## 2016-07-26 MED ORDER — OXYCODONE HCL 5 MG/5ML PO SOLN
5.0000 mg | ORAL | Status: DC | PRN
  Administered 2016-07-27 – 2016-07-29 (×9): 5 mg
  Filled 2016-07-26 (×10): qty 5

## 2016-07-26 MED ORDER — CLOPIDOGREL BISULFATE 75 MG PO TABS
75.0000 mg | ORAL_TABLET | Freq: Every day | ORAL | Status: DC
  Administered 2016-07-26 – 2016-07-30 (×5): 75 mg
  Filled 2016-07-26 (×5): qty 1

## 2016-07-26 NOTE — Progress Notes (Signed)
Pharmacy Antibiotic Note  Tony [name] is a 71 y.o. male admitted on 07/23/2016 with MRSA pneumonia.  Pharmacy has been consulted for vancomycin dosing. Today is D#4 vancomycin. Pt is afebrile, WBC up to 11.2 but overall down, SCr stable with good UOP at 1.4 ml/kg/hr.   Vancomycin trough is just below goal at 14. Will increase dose to target middle range trough goal.   Plan: Increase vancomycin to 1000 mg IV q12h  Vancomycin trough as needed (GOAL = 15-20) Monitor clinical picture, renal function, Tmax, CBC  Height: 5' 8.9" (175 cm) Weight: 208 lb 3.2 oz (94.4 kg) IBW/kg (Calculated) : 70.47  Temp (24hrs), Avg:99 F (37.2 C), Min:98.3 F (36.8 C), Max:99.8 F (37.7 C)   Recent Labs Lab 07/23/16 1054 07/23/16 1540 07/23/16 1810 07/24/16 0440 07/24/16 1610 07/25/16 0449 07/25/16 1700 07/26/16 0555 07/26/16 1453  WBC  --  21.1* 15.4* 9.7  --  9.8  --  11.2*  --   CREATININE  --  1.26* 1.04 0.92 0.99 0.83 0.84 0.78  --   LATICACIDVEN 1.65  --   --   --   --   --   --   --   --   VANCOTROUGH  --   --   --   --   --   --   --   --  14*    Estimated Creatinine Clearance: 100.1 mL/min (by C-G formula based on SCr of 0.78 mg/dL).    Allergies  Allergen Reactions  . Azithromycin Anaphylaxis  . Fentanyl Anaphylaxis    Tolerated oxycodone at [facility]  . Acetaminophen Other (See Comments)    angioedema  . Morphine And Related Itching    Tolerated oxycodone at [facility]   ABX: Vancomycin 1/31>> Zosyn 1/31>>2/2  Culture data:  1/31 MRSA PCR: neg 1/31 Trach asp: MRSA 1/31 BCx: ng 3d 1/31 UCx: multiple species  Dose Adjustments:  2/3 VT 14 on 750 mg q12 - increase to 1 g q12    Thank you for allowing pharmacy to be a part of this patient's care.  Loura BackJennifer [place], PharmD, BCPS Clinical Pharmacist Main pharmacy [phone] 07/26/2016 3:56 PM

## 2016-07-26 NOTE — Progress Notes (Addendum)
[place] TEAM 1 - Stepdown/ICU TEAM  Tony [name]  YCX:[id number] DOB: Nov 14, 1945 DOA: 07/23/2016 PCP: Pcp Not In System    Brief Narrative:  71 year old male with previous cardiac arrest tx at [facility] that required a trach/peg but evidently was neurologically intact.  Patient suffered a cardiac arrest at [facility] with 2 rounds of CPR at [place] and one round at [facility].  They were unable to move air after the third code and decision was made to intubate from above and remove trach.  EDP communicated with sister who is his main decision maker and full code status confirmed.  Patient remained completely unresponsive and in refractory shock post third code and PCCM was called to admit.  Subjective: Pt is resting comfortably in bed.  He has been off ventilator support all day.  He reports some ongoing musculoskeletal type chest pain related to his CPR.  He denies abdominal pain nausea or vomiting.  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  Weaning per PCCM   MRSA RLL Pneumonia  Remains on Vanc  Anemia No evidence of bleeding - felt to be due to critical illness - follow hemoglobin trend - transfuse as needed to keep hemoglobin greater than 7.9  CAD s/p BMS to RCA in 2008 - MI/arrest 05/27/16 tx at [place] following - this arrest felt to have been respiratory in etiology  HTN Blood pressure reasonably controlled  DM CBG well controlled at present  COPD on home O2 No wheezing at time of exam today  Hypokalemia Replace - check Mg   PVD [facility] notes confirm B iliac stents which require continued use of Plavix - have resumed   DVT prophylaxis: [place] heparin  Code Status: FULL CODE Family Communication: no family present at time of exam  Disposition Plan: SDU on vent    Consultants:  PCCM [facility]   Antimicrobials:  Zosyn 1/31 > 2/2 Vancomycin 1/31 >  Objective: Blood pressure (!) 155/86, pulse (!) 104, temperature 98.3 F (36.8 C), temperature source Oral, resp. rate (!) 35, height 5' 8.9" (1.75 m), weight 94.4 kg (208 lb 3.2 oz), SpO2 95 %.  Intake/Output Summary (Last 24 hours) at 07/26/16 1559 Last data filed at 07/26/16 1503  Gross per 24 hour  Intake           1497.5 ml  Output              775 ml  Net            722.5 ml   Filed Weights   07/24/16 0500 07/25/16 0157 07/26/16 0155  Weight: 93.9 kg (207 lb 0.2 oz) 96.4 kg (212 lb 8.4 oz) 94.4 kg (208 lb 3.2 oz)    Examination: General: No acute respiratory distress - alert  Lungs: Clear to auscultation bilaterally without wheezes or crackles Cardiovascular: Regular rate and rhythm without murmur gallop or rub normal S1 and S2 Abdomen: Nontender, nondistended, soft, bowel sounds positive, no rebound, no ascites, no appreciable mass Extremities: No significant cyanosis, clubbing, or edema bilateral lower extremities  CBC:  Recent Labs Lab 07/23/16 1044 07/23/16 1540 07/23/16 1810 07/24/16 0440 07/25/16 0449 07/26/16 0555  WBC 12.1* 21.1* 15.4* 9.7 9.8 11.2*  NEUTROABS 9.7* 18.0*  --   --   --   --  HGB 8.5* 9.9* 7.8* 7.0* 7.1* 8.0*  HCT 28.8* 33.0* 25.5* 22.2* 22.4* 26.0*  MCV 93.8 91.9 89.2 87.4 89.6 90.3  PLT 307 342 292 236 207 251   Basic Metabolic Panel:  Recent Labs Lab 07/24/16 0440 07/24/16 0944 07/24/16 1610 07/25/16 0449 07/25/16 1700 07/26/16 0555  NA 136  --  138 140 142 141  K 3.1*  --  3.3* 2.9* 3.9 3.3*  CL 100*  --  102 104 106 106  CO2 25  --  29 27 29 28  GLUCOSE 165*  --  121* 119* 110* 110*  BUN 14  --  18 19 16 17  CREATININE 0.92  --  0.99 0.83 0.84 0.78  CALCIUM 7.5*  --  8.2* 7.9* 8.2* 8.4*  MG 1.5* 2.0 2.1 1.9 1.9  --   PHOS 2.6 2.4* 2.1* 1.8* 3.3  --    GFR: Estimated Creatinine Clearance: 100.1 mL/min (by C-G formula  based on SCr of 0.78 mg/dL).  Liver Function Tests:  Recent Labs Lab 07/23/16 1044 07/23/16 1540  AST 60* 53*  ALT 22 29  ALKPHOS 70 86  BILITOT 0.8 0.8  PROT 6.4* 7.7  ALBUMIN 2.0* 2.4*    Recent Labs Lab 07/23/16 1044  LIPASE 21   Coagulation Profile:  Recent Labs Lab 07/23/16 1540  INR 1.24   CBG:  Recent Labs Lab 07/25/16 1936 07/26/16 0024 07/26/16 0443 07/26/16 0754 07/26/16 1155  GLUCAP 89 107* 110* 107* 103*    Recent Results (from the past 240 hour(s))  Urine culture     Status: Abnormal   Collection Time: 07/23/16 11:26 AM  Result Value Ref Range Status   Specimen Description URINE, RANDOM  Final   Special Requests NONE  Final   Culture MULTIPLE SPECIES PRESENT, SUGGEST RECOLLECTION (A)  Final   Report Status 07/24/2016 FINAL  Final  Culture, blood (Routine X 2) w Reflex to ID Panel     Status: None (Preliminary result)   Collection Time: 07/23/16  2:44 PM  Result Value Ref Range Status   Specimen Description BLOOD BLOOD LEFT HAND  Final   Special Requests IN PEDIATRIC BOTTLE 1CC  Final   Culture NO GROWTH 3 DAYS  Final   Report Status PENDING  Incomplete  Culture, blood (Routine X 2) w Reflex to ID Panel     Status: None (Preliminary result)   Collection Time: 07/23/16  2:44 PM  Result Value Ref Range Status   Specimen Description BLOOD BLOOD LEFT HAND  Final   Special Requests IN PEDIATRIC BOTTLE 1CC  Final   Culture NO GROWTH 3 DAYS  Final   Report Status PENDING  Incomplete  MRSA PCR Screening     Status: None   Collection Time: 07/23/16  3:04 PM  Result Value Ref Range Status   MRSA by PCR NEGATIVE NEGATIVE Final    Comment:        The GeneXpert MRSA Assay (FDA approved for NASAL specimens only), is one component of a comprehensive MRSA colonization surveillance program. It is not intended to diagnose MRSA infection nor to guide or monitor treatment for MRSA infections.   Culture, respiratory (NON-Expectorated)     Status:  None   Collection Time: 07/23/16  3:50 PM  Result Value Ref Range Status   Specimen Description TRACHEAL ASPIRATE  Final   Special Requests NONE  Final   Gram Stain   Final    FEW WBC PRESENT,BOTH PMN AND MONONUCLEAR RARE GRAM POSITIVE COCCI      Culture   Final    MODERATE METHICILLIN RESISTANT STAPHYLOCOCCUS AUREUS   Report Status 07/26/2016 FINAL  Final   Organism ID, Bacteria METHICILLIN RESISTANT STAPHYLOCOCCUS AUREUS  Final      Susceptibility   Methicillin resistant staphylococcus aureus - MIC*    CIPROFLOXACIN >=8 RESISTANT Resistant     ERYTHROMYCIN >=8 RESISTANT Resistant     GENTAMICIN <=0.5 SENSITIVE Sensitive     OXACILLIN >=4 RESISTANT Resistant     TETRACYCLINE <=1 SENSITIVE Sensitive     VANCOMYCIN 1 SENSITIVE Sensitive     TRIMETH/SULFA <=10 SENSITIVE Sensitive     CLINDAMYCIN <=0.25 SENSITIVE Sensitive     RIFAMPIN <=0.5 SENSITIVE Sensitive     Inducible Clindamycin NEGATIVE Sensitive     * MODERATE METHICILLIN RESISTANT STAPHYLOCOCCUS AUREUS     Scheduled Meds: . albuterol  2.5 mg Nebulization Q4H  . albuterol  5 mg Nebulization Once  . chlorhexidine gluconate (MEDLINE KIT)  15 mL Mouth Rinse BID  . feeding supplement (PRO-STAT SUGAR FREE 64)  30 mL Per Tube QID  . feeding supplement (VITAL HIGH PROTEIN)  1,000 mL Per Tube Q24H  . furosemide  40 mg Oral Daily  . heparin  5,000 Units Subcutaneous Q8H  . insulin aspart  0-15 Units Subcutaneous Q4H  . mouth rinse  15 mL Mouth Rinse QID  . pantoprazole sodium  40 mg Per Tube Daily  . [START ON 07/27/2016] vancomycin  1,000 mg Intravenous Q12H     LOS: 3 days   Jeffrey T. McClung, MD [facility] Office  [phone] Pager - Text Page per Amion as per below:  On-Call/Text Page:      [email or web address]      password [email or web address]  If 7PM-7AM, please contact night-coverage [email or web address] Password [email or web address] 07/26/2016, 4:00 PM     

## 2016-07-27 DIAGNOSIS — E876 Hypokalemia: Secondary | ICD-10-CM

## 2016-07-27 LAB — COMPREHENSIVE METABOLIC PANEL
ALBUMIN: 2.4 g/dL — AB (ref 3.5–5.0)
ALK PHOS: 61 U/L (ref 38–126)
ALT: 21 U/L (ref 17–63)
AST: 27 U/L (ref 15–41)
Anion gap: 8 (ref 5–15)
BILIRUBIN TOTAL: 0.9 mg/dL (ref 0.3–1.2)
BUN: 20 mg/dL (ref 6–20)
CO2: 28 mmol/L (ref 22–32)
Calcium: 8.8 mg/dL — ABNORMAL LOW (ref 8.9–10.3)
Chloride: 105 mmol/L (ref 101–111)
Creatinine, Ser: 0.87 mg/dL (ref 0.61–1.24)
GFR calc Af Amer: >60 mL/min (ref >60)
GLUCOSE: 121 mg/dL — AB (ref 65–99)
POTASSIUM: 3.3 mmol/L — AB (ref 3.5–5.1)
Sodium: 141 mmol/L (ref 135–145)
TOTAL PROTEIN: 7.1 g/dL (ref 6.5–8.1)

## 2016-07-27 LAB — GLUCOSE, CAPILLARY
GLUCOSE-CAPILLARY: 117 mg/dL — AB (ref 65–99)
GLUCOSE-CAPILLARY: 115 mg/dL — AB (ref 65–99)
Glucose-Capillary: 105 mg/dL — ABNORMAL HIGH (ref 65–99)
Glucose-Capillary: 108 mg/dL — ABNORMAL HIGH (ref 65–99)
Glucose-Capillary: 112 mg/dL — ABNORMAL HIGH (ref 65–99)
Glucose-Capillary: 97 mg/dL (ref 65–99)

## 2016-07-27 LAB — CBC
HEMATOCRIT: 27.1 % — AB (ref 39.0–52.0)
Hemoglobin: 8.4 g/dL — ABNORMAL LOW (ref 13.0–17.0)
MCH: 28.1 pg (ref 26.0–34.0)
MCHC: 31 g/dL (ref 30.0–36.0)
MCV: 90.6 fL (ref 78.0–100.0)
PLATELETS: 246 10*3/uL (ref 150–400)
RBC: 2.99 MIL/uL — ABNORMAL LOW (ref 4.22–5.81)
RDW: 15.6 % — AB (ref 11.5–15.5)
WBC: 13.3 10*3/uL — AB (ref 4.0–10.5)

## 2016-07-27 MED ORDER — POTASSIUM CHLORIDE 20 MEQ/15ML (10%) PO SOLN
40.0000 meq | Freq: Three times a day (TID) | ORAL | Status: AC
  Administered 2016-07-27 – 2016-07-28 (×3): 40 meq
  Filled 2016-07-27 (×3): qty 30

## 2016-07-27 MED FILL — Medication: Qty: 1 | Status: AC

## 2016-07-27 NOTE — Progress Notes (Signed)
[place] TEAM 1 - Stepdown/ICU TEAM  Tony [name]  UMP:[id number] DOB: [birth year] DOA: 07/23/2016 PCP: Pcp Not In System    Brief Narrative:  71 year old male with previous cardiac arrest tx at [facility] that required a trach/peg but evidently was neurologically intact.  Patient suffered a cardiac arrest at [facility] with 2 rounds of CPR at [place] and one round at [facility].  They were unable to move air after the third code and decision was made to intubate from above and remove trach.  EDP communicated with sister who is his main decision maker and full code status confirmed.  Patient remained completely unresponsive and in refractory shock post third code and PCCM was called to admit.  Subjective: The patient is in good spirits at the time of visit.  He reports ongoing chest wall pain but states it is reasonably controlled at this time.  He denies abdominal pain nausea vomiting or shortness of breath.  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  Weaning per PCCM   MRSA RLL Pneumonia  Remains on Vanc  Anemia No evidence of bleeding - felt to be due to critical illness - follow hemoglobin trend - transfuse as needed to keep hemoglobin greater than 7.9  Recent Labs Lab 07/23/16 1810 07/24/16 0440 07/25/16 0449 07/26/16 0555 07/27/16 0628  HGB 7.8* 7.0* 7.1* 8.0* 8.4*    CAD s/p BMS to RCA in 2008 - MI/arrest 05/27/16 tx at [place] following - this arrest was felt to have been respiratory in etiology  HTN Blood pressure controlled  DM CBG well controlled   COPD on home O2 No wheezing at time of exam today  Hypokalemia Persists - cont to supplement - check Mg in AM   PVD [facility] notes confirm B iliac stents which require continued use of Plavix - have resumed   DVT  prophylaxis:  heparin  Code Status: FULL CODE Family Communication: no family present at time of exam  Disposition Plan: SDU on vent   Consultants:  PCCM [facility]   Antimicrobials:  Zosyn 1/31 > 2/2 Vancomycin 1/31 >  Objective: Blood pressure 125/67, pulse 71, temperature 98.1 F (36.7 C), temperature source Oral, resp. rate 20, height 5' 8.9" (1.75 m), weight 94.4 kg (208 lb 3.2 oz), SpO2 92 %.  Intake/Output Summary (Last 24 hours) at 07/27/16 1621 Last data filed at 07/27/16 0742  Gross per 24 hour  Intake            17441 ml  Output              625 ml  Net            16816 ml   Filed Weights   07/24/16 0500 07/25/16 0157 07/26/16 0155  Weight: 93.9 kg (207 lb 0.2 oz) 96.4 kg (212 lb 8.4 oz) 94.4 kg (208 lb 3.2 oz)    Examination: General: No acute respiratory distress - alert and talkative  Lungs: Clear to auscultation bilaterally - no wheezing  Cardiovascular: Regular rate and rhythm without murmur  Abdomen: Nontender, nondistended, soft, bowel sounds positive, no rebound, no ascites, no appreciable mass Extremities: No significant cyanosis, clubbing, edema bilateral lower extremities  CBC:  Recent Labs Lab 07/23/16 1044 07/23/16 1540 07/23/16 1810 07/24/16 0440 07/25/16 0449 07/26/16  1423 07/27/16 0628  WBC 12.1* 21.1* 15.4* 9.7 9.8 11.2* 13.3*  NEUTROABS 9.7* 18.0*  --   --   --   --   --   HGB 8.5* 9.9* 7.8* 7.0* 7.1* 8.0* 8.4*  HCT 28.8* 33.0* 25.5* 22.2* 22.4* 26.0* 27.1*  MCV 93.8 91.9 89.2 87.4 89.6 90.3 90.6  PLT 307 342 292 236 207 251 953   Basic Metabolic Panel:  Recent Labs Lab 07/24/16 0440 07/24/16 0944 07/24/16 1610 07/25/16 0449 07/25/16 1700 07/26/16 0555 07/27/16 0628  NA 136  --  138 140 142 141 141  K 3.1*  --  3.3* 2.9* 3.9 3.3* 3.3*  CL 100*  --  102 104 106 106 105  CO2 25  --  29 27 29 28 28   GLUCOSE 165*  --  121* 119* 110* 110* 121*  BUN 14  --  18 19 16 17 20   CREATININE 0.92  --  0.99 0.83 0.84 0.78  0.87  CALCIUM 7.5*  --  8.2* 7.9* 8.2* 8.4* 8.8*  MG 1.5* 2.0 2.1 1.9 1.9  --   --   PHOS 2.6 2.4* 2.1* 1.8* 3.3  --   --    GFR: Estimated Creatinine Clearance: 92.1 mL/min (by C-G formula based on SCr of 0.87 mg/dL).  Liver Function Tests:  Recent Labs Lab 07/23/16 1044 07/23/16 1540 07/27/16 0628  AST 60* 53* 27  ALT 22 29 21   ALKPHOS 70 86 61  BILITOT 0.8 0.8 0.9  PROT 6.4* 7.7 7.1  ALBUMIN 2.0* 2.4* 2.4*    Recent Labs Lab 07/23/16 1044  LIPASE 21   Coagulation Profile:  Recent Labs Lab 07/23/16 1540  INR 1.24   CBG:  Recent Labs Lab 07/27/16 0022 07/27/16 0353 07/27/16 0741 07/27/16 1215 07/27/16 1619  GLUCAP 97 117* 105* 108* 115*    Recent Results (from the past 240 hour(s))  Urine culture     Status: Abnormal   Collection Time: 07/23/16 11:26 AM  Result Value Ref Range Status   Specimen Description URINE, RANDOM  Final   Special Requests NONE  Final   Culture MULTIPLE SPECIES PRESENT, SUGGEST RECOLLECTION (A)  Final   Report Status 07/24/2016 FINAL  Final  Culture, blood (Routine X 2) w Reflex to ID Panel     Status: None (Preliminary result)   Collection Time: 07/23/16  2:44 PM  Result Value Ref Range Status   Specimen Description BLOOD BLOOD LEFT HAND  Final   Special Requests IN PEDIATRIC BOTTLE 1CC  Final   Culture NO GROWTH 4 DAYS  Final   Report Status PENDING  Incomplete  Culture, blood (Routine X 2) w Reflex to ID Panel     Status: None (Preliminary result)   Collection Time: 07/23/16  2:44 PM  Result Value Ref Range Status   Specimen Description BLOOD BLOOD LEFT HAND  Final   Special Requests IN PEDIATRIC BOTTLE 1CC  Final   Culture NO GROWTH 4 DAYS  Final   Report Status PENDING  Incomplete  MRSA PCR Screening     Status: None   Collection Time: 07/23/16  3:04 PM  Result Value Ref Range Status   MRSA by PCR NEGATIVE NEGATIVE Final    Comment:        The GeneXpert MRSA Assay (FDA approved for NASAL specimens only), is one  component of a comprehensive MRSA colonization surveillance program. It is not intended to diagnose MRSA infection nor to guide or monitor treatment for MRSA infections.  Culture, respiratory (NON-Expectorated)     Status: None   Collection Time: 07/23/16  3:50 PM  Result Value Ref Range Status   Specimen Description TRACHEAL ASPIRATE  Final   Special Requests NONE  Final   Gram Stain   Final    FEW WBC PRESENT,BOTH PMN AND MONONUCLEAR RARE GRAM POSITIVE COCCI    Culture   Final    MODERATE METHICILLIN RESISTANT STAPHYLOCOCCUS AUREUS   Report Status 07/26/2016 FINAL  Final   Organism ID, Bacteria METHICILLIN RESISTANT STAPHYLOCOCCUS AUREUS  Final      Susceptibility   Methicillin resistant staphylococcus aureus - MIC*    CIPROFLOXACIN >=8 RESISTANT Resistant     ERYTHROMYCIN >=8 RESISTANT Resistant     GENTAMICIN <=0.5 SENSITIVE Sensitive     OXACILLIN >=4 RESISTANT Resistant     TETRACYCLINE <=1 SENSITIVE Sensitive     VANCOMYCIN 1 SENSITIVE Sensitive     TRIMETH/SULFA <=10 SENSITIVE Sensitive     CLINDAMYCIN <=0.25 SENSITIVE Sensitive     RIFAMPIN <=0.5 SENSITIVE Sensitive     Inducible Clindamycin NEGATIVE Sensitive     * MODERATE METHICILLIN RESISTANT STAPHYLOCOCCUS AUREUS     Scheduled Meds: . albuterol  2.5 mg Nebulization Q4H  . albuterol  5 mg Nebulization Once  . aspirin  81 mg Per Tube Daily  . chlorhexidine gluconate (MEDLINE KIT)  15 mL Mouth Rinse BID  . clopidogrel  75 mg Per Tube Daily  . feeding supplement (PRO-STAT SUGAR FREE 64)  30 mL Per Tube QID  . feeding supplement (VITAL HIGH PROTEIN)  1,000 mL Per Tube Q24H  . furosemide  40 mg Oral Daily  . heparin  5,000 Units Subcutaneous Q8H  . insulin aspart  0-15 Units Subcutaneous Q4H  . levothyroxine  125 mcg Per Tube QAC breakfast  . mouth rinse  15 mL Mouth Rinse QID  . pantoprazole sodium  40 mg Per Tube Daily  . vancomycin  1,000 mg Intravenous Q12H     LOS: 4 days   Cherene Altes,  MD [facility] Office  [phone] Pager - Text Page per Amion as per below:  On-Call/Text Page:      [email or web address]      password [email or web address]  If 7PM-7AM, please contact night-coverage [email or web address] Password [email or web address] 07/27/2016, 4:21 PM

## 2016-07-28 LAB — CULTURE, BLOOD (ROUTINE X 2)
Culture: NO GROWTH
Culture: NO GROWTH

## 2016-07-28 LAB — GLUCOSE, CAPILLARY
GLUCOSE-CAPILLARY: 107 mg/dL — AB (ref 65–99)
GLUCOSE-CAPILLARY: 107 mg/dL — AB (ref 65–99)
GLUCOSE-CAPILLARY: 108 mg/dL — AB (ref 65–99)
GLUCOSE-CAPILLARY: 108 mg/dL — AB (ref 65–99)
GLUCOSE-CAPILLARY: 135 mg/dL — AB (ref 65–99)
GLUCOSE-CAPILLARY: 94 mg/dL (ref 65–99)
Glucose-Capillary: 109 mg/dL — ABNORMAL HIGH (ref 65–99)

## 2016-07-28 LAB — CBC
HEMATOCRIT: 26.8 % — AB (ref 39.0–52.0)
HEMOGLOBIN: 8.3 g/dL — AB (ref 13.0–17.0)
MCH: 28.1 pg (ref 26.0–34.0)
MCHC: 31 g/dL (ref 30.0–36.0)
MCV: 90.8 fL (ref 78.0–100.0)
Platelets: 262 10*3/uL (ref 150–400)
RBC: 2.95 MIL/uL — AB (ref 4.22–5.81)
RDW: 15.8 % — ABNORMAL HIGH (ref 11.5–15.5)
WBC: 12.2 10*3/uL — ABNORMAL HIGH (ref 4.0–10.5)

## 2016-07-28 LAB — COMPREHENSIVE METABOLIC PANEL
ALBUMIN: 2.4 g/dL — AB (ref 3.5–5.0)
ALT: 26 U/L (ref 17–63)
ANION GAP: 5 (ref 5–15)
AST: 30 U/L (ref 15–41)
Alkaline Phosphatase: 63 U/L (ref 38–126)
BILIRUBIN TOTAL: 1.1 mg/dL (ref 0.3–1.2)
BUN: 21 mg/dL — ABNORMAL HIGH (ref 6–20)
CO2: 28 mmol/L (ref 22–32)
Calcium: 8.7 mg/dL — ABNORMAL LOW (ref 8.9–10.3)
Chloride: 105 mmol/L (ref 101–111)
Creatinine, Ser: 0.84 mg/dL (ref 0.61–1.24)
GFR calc non Af Amer: >60 mL/min (ref >60)
GLUCOSE: 177 mg/dL — AB (ref 65–99)
POTASSIUM: 3.7 mmol/L (ref 3.5–5.1)
SODIUM: 138 mmol/L (ref 135–145)
TOTAL PROTEIN: 7.2 g/dL (ref 6.5–8.1)

## 2016-07-28 LAB — MAGNESIUM: MAGNESIUM: 1.7 mg/dL (ref 1.7–2.4)

## 2016-07-28 MED ORDER — ALBUTEROL SULFATE (2.5 MG/3ML) 0.083% IN NEBU
2.5000 mg | INHALATION_SOLUTION | Freq: Four times a day (QID) | RESPIRATORY_TRACT | Status: DC
  Administered 2016-07-28 – 2016-07-29 (×5): 2.5 mg via RESPIRATORY_TRACT
  Filled 2016-07-28 (×4): qty 3

## 2016-07-28 MED ORDER — VANCOMYCIN HCL IN DEXTROSE 1-5 GM/200ML-% IV SOLN
1000.0000 mg | Freq: Two times a day (BID) | INTRAVENOUS | Status: AC
  Administered 2016-07-29 (×2): 1000 mg via INTRAVENOUS
  Filled 2016-07-28 (×2): qty 200

## 2016-07-28 MED ORDER — POTASSIUM CHLORIDE 20 MEQ/15ML (10%) PO SOLN
40.0000 meq | Freq: Every day | ORAL | Status: DC
  Administered 2016-07-29 – 2016-07-30 (×2): 40 meq
  Filled 2016-07-28 (×2): qty 30

## 2016-07-28 MED ORDER — ALBUTEROL SULFATE (2.5 MG/3ML) 0.083% IN NEBU
2.5000 mg | INHALATION_SOLUTION | RESPIRATORY_TRACT | Status: DC | PRN
Start: 2016-07-28 — End: 2016-07-30
  Administered 2016-07-28 – 2016-07-30 (×4): 2.5 mg via RESPIRATORY_TRACT
  Filled 2016-07-28 (×4): qty 3

## 2016-07-28 NOTE — Progress Notes (Addendum)
RT called to room by RN for patient desating. Upon arrival, patients sats were 82%, RR 38, HR 134, labored breathing and very diaphoretic. Flipped patient back to full support and suctioned patient with no relief of SOB. Peak alarm adjusted on ventilator from 45 to 55 to allow patient to obtain his set VT. On ascultation, very diminished expiratory wheezing heard, very minimal air movement at this time. Bag-lavaged patient, however, no plugging noted. Small amounts of old blood obtained. Breathing treatment to be given. RN aware.

## 2016-07-28 NOTE — Progress Notes (Signed)
PULMONARY / CRITICAL CARE MEDICINE   Name: Tony [name] MRN: [id number] DOB: March 21, 1946    ADMISSION DATE:  07/23/2016 CONSULTATION DATE:  07/23/2016  REFERRING MD:  EDP - Dr. Jacqulyn Bath  CHIEF COMPLAINT:  Cardiac arrest and respiratory failure  BRIEF SUMMARY:   71 year old male with previous cardiac arrest in [place] that required a trach/peg but evidently was neurologically intact.  Patient suffered a cardiac arrest at [facility] with 2 rounds of CPR in [facility] and one round in [place]. There was later difficulty with ventilation via BVM and the patient was orally intubated and trach was removed.   EDP communicated with sister who is his main decision maker and full code status confirmed.  Patient remained completely unresponsive and in refractory shock post third code and PCCM was called to admit.  He was found to have MRSA PNA which was treated with vancomycin.  The patient later stabilized and was transferred to SDU and to [facility] service.  PCCM continues to follow for trach / vent needs.    SUBJECTIVE: weaned PS 10 x 2 hours  VITAL SIGNS: BP 124/80   Pulse 86   Temp 98.7 F (37.1 C) (Oral)   Resp (!) 22   Ht 5' 8.9" (1.75 m)   Wt 211 lb 3.2 oz (95.8 kg)   SpO2 100%   BMI 31.28 kg/m   HEMODYNAMICS:    VENTILATOR SETTINGS: Vent Mode: PRVC FiO2 (%):  [30 %-50 %] 40 % Set Rate:  [20 bmp] 20 bmp Vt Set:  [560 mL] 560 mL PEEP:  [5 cmH20] 5 cmH20 Pressure Support:  [10 cmH20] 10 cmH20 Plateau Pressure:  [19 cmH20-31 cmH20] 20 cmH20  INTAKE / OUTPUT: I/O last 3 completed shifts: In: 46270 [P.O.:16681; NG/GT:1400; IV Piggyback:600] Out: 2875 [Urine:2875]  PHYSICAL EXAMINATION: General:  Awakens, on vent rest HEENT: MM pink/moist, trach clean Neuro: awakens, follow commands, moves uppers CV: s1s2 rrr, no m/r/g PULM: romchi bilateral JJ:KKXF, non-tender, bsx4 active  Extremities:2 plus edema  Skin: no rashes or lesions   LABS:  BMET  Recent Labs Lab 07/26/16 0555  07/27/16 0628 07/28/16 0426  NA 141 141 138  K 3.3* 3.3* 3.7  CL 106 105 105  CO2 28 28 28   BUN 17 20 21*  CREATININE 0.78 0.87 0.84  GLUCOSE 110* 121* 177*    Electrolytes  Recent Labs Lab 07/24/16 1610 07/25/16 0449 07/25/16 1700 07/26/16 0555 07/27/16 0628 07/28/16 0426  CALCIUM 8.2* 7.9* 8.2* 8.4* 8.8* 8.7*  MG 2.1 1.9 1.9  --   --  1.7  PHOS 2.1* 1.8* 3.3  --   --   --     CBC  Recent Labs Lab 07/26/16 0555 07/27/16 0628 07/28/16 0426  WBC 11.2* 13.3* 12.2*  HGB 8.0* 8.4* 8.3*  HCT 26.0* 27.1* 26.8*  PLT 251 246 262    ABG  Recent Labs Lab 07/23/16 1156 07/23/16 1309  PHART 7.236* 7.487*  PCO2ART 78.6* 38.0  PO2ART 48.0* 227.0*   Glucose  Recent Labs Lab 07/27/16 2003 07/28/16 0020 07/28/16 0406 07/28/16 0805 07/28/16 1300 07/28/16 1540  GLUCAP 112* 107* 107* 108* 135* 108*    Imaging No results found.   STUDIES:  CT head 1/31 >> (-) EEG 1/31 >> (-)  CULTURES: Blood 1/31 >> No growth  Urine 1/31 >> No growth  Sputum 1/31 >> No Growth  ANTIBIOTICS: Vancomycin 1/31 >> 07/25/16 Zosyn 1/31 >>07/25/16  SIGNIFICANT EVENTS:   LINES/TUBES: ETT 1/31 >> Perc re-do trach 1/31 >> PICC  RUE (pta) >>   DISCUSSION: 71 y/o male with complex PMH including [facility], now at [facility] trach/PEG dependent.  [facility], then [facility].  Unable to bag pt through trach in ER so he was intubated orally.  Aprrox 25 mins total CPR.  A new trach was placed on 1/31.   ASSESSMENT / PLAN:   Acute on chronic hypoxemic and hypercapneic respiratory failure secondary to mucus plugging + MRSA HCAP of RLL +/- Pulm edema (at [facility]) S/P Trach change on 1/31 Hx COPD  P:   PRVC PRN  Did PS 10 but then had set back and failed abrupt at 2 hours requiring nebs; would NOT use trach collar as of now Would repeat PS 10 in am for goal bid, would NOT reduce  ps past 10 as of now It appears he has need for further diuresis, pcxr edema remains and failed weaning Trach care per protocol  Wean O2 for sats 88-94% Abx per primary Albuterol Q4 & Q2 PRN  Pulmonary hygiene - IS, mobilize Get pcxr wed-= thr  Will follow end of week, if successful PS 10 bid then go to 4 hours  Mcarthur Rossettianiel J. Tyson AliasFeinstein, MD, FACP Pgr: [phone] [facility]

## 2016-07-28 NOTE — NC FL2 (Signed)
[place] LEVEL OF CARE SCREENING TOOL     IDENTIFICATION  Patient Name: Tony [name] Birthdate: Nov 09, 1945 Sex: male Admission Date (Current Location): 07/23/2016  [facility] and [place] Number:  Herbalist and Address:  [facility], [place] [address], [place], [place] [address]      Provider Number: [id number]  Attending Physician Name and Address:  Cherene Altes, MD  Relative Name and Phone Number:  Inez Catalina, sister, [phone]    Current Level of Care: Hospital Recommended Level of Care: Vent SNF Prior Approval Number:    Date Approved/Denied:   PASRR Number: [id number]  Discharge Plan: SNF    Current Diagnoses: Patient Active Problem List   Diagnosis Date Noted  . HCAP (healthcare-associated pneumonia)   . Cardiac arrest ([place]) 07/23/2016  . Acute respiratory failure with hypoxemia (HCC)     Orientation RESPIRATION BLADDER Height & Weight     Self, Time, Situation, Place  Vent, Tracheostomy (Vent-40%; 9m cuffed trach) Continent Weight: 95.8 kg (211 lb 3.2 oz) Height:  5' 8.9" (175 cm)  BEHAVIORAL SYMPTOMS/MOOD NEUROLOGICAL BOWEL NUTRITION STATUS      Incontinent Diet  AMBULATORY STATUS COMMUNICATION OF NEEDS Skin   Extensive Assist Verbally (Intubated) Normal                       Personal Care Assistance Level of Assistance  Bathing, Feeding, Dressing Bathing Assistance: Maximum assistance Feeding assistance: Maximum assistance Dressing Assistance: Maximum assistance     Functional Limitations Info  Speech     Speech Info: Impaired (Intubated)    SPECIAL CARE FACTORS FREQUENCY  PT (By licensed PT)     PT Frequency: not assessed              Contractures      Additional Factors Info  Code Status, Allergies, Isolation Precautions, Insulin Sliding Scale Code Status Info: Partial Allergies Info: Azithromycin, Fentanyl, Acetaminophen, Morphine And Related   Insulin Sliding Scale Info:  Every four hours Isolation Precautions Info: MRSA     Current Medications (07/28/2016):  This is the current hospital active medication list Current Facility-Administered Medications  Medication Dose Route Frequency Provider Last Rate Last Dose  . albuterol (PROVENTIL) (2.5 MG/3ML) 0.083% nebulizer solution 2.5 mg  2.5 mg Nebulization Q4H KFlora Lipps MD   2.5 mg at 07/28/16 1538  . albuterol (PROVENTIL) (2.5 MG/3ML) 0.083% nebulizer solution 2.5 mg  2.5 mg Nebulization Q2H PRN JCherene Altes MD   2.5 mg at 07/28/16 1222  . aspirin chewable tablet 81 mg  81 mg Per Tube Daily JCherene Altes MD   81 mg at 07/28/16 04268 . chlorhexidine gluconate (MEDLINE KIT) (PERIDEX) 0.12 % solution 15 mL  15 mL Mouth Rinse BID WRush Farmer MD   15 mL at 07/28/16 0806  . clopidogrel (PLAVIX) tablet 75 mg  75 mg Per Tube Daily JCherene Altes MD   75 mg at 07/28/16 03419 . feeding supplement (PRO-STAT SUGAR FREE 64) liquid 30 mL  30 mL Per Tube QID J[place] MD   30 mL at 07/28/16 1310  . feeding supplement (VITAL HIGH PROTEIN) liquid 1,000 mL  1,000 mL Per Tube Q24H Asiyah ZCletis Media MD   1,000 mL at 07/28/16 0933  . furosemide (LASIX) tablet 40 mg  40 mg Oral Daily JCentralia MD   40 mg at 07/28/16 06222 . heparin injection 5,000  Units  5,000 Units Subcutaneous Q8H Flora Lipps, MD   5,000 Units at 07/28/16 1309  . HYDROmorphone (DILAUDID) injection 0.25-0.5 mg  0.25-0.5 mg Intravenous Q2H PRN Jose Shirl Harris, MD   0.5 mg at 07/27/16 0058  . insulin aspart (novoLOG) injection 0-15 Units  0-15 Units Subcutaneous Q4H Rush Farmer, MD   2 Units at 07/28/16 1310  . levothyroxine (SYNTHROID, LEVOTHROID) tablet 125 mcg  125 mcg Per Tube QAC breakfast Cherene Altes, MD   125 mcg at 07/28/16 0805  . MEDLINE mouth rinse  15 mL Mouth Rinse QID Rush Farmer, MD   15 mL at 07/28/16 1513  . naphazoline-glycerin (CLEAR EYES) ophth solution 1-2 drop  1-2 drop Both Eyes QID  PRN Rigoberto Noel, MD   2 drop at 07/27/16 1600  . oxyCODONE (ROXICODONE) 5 MG/5ML solution 5 mg  5 mg Per Tube Q3H PRN Cherene Altes, MD   5 mg at 07/28/16 1310  . pantoprazole sodium (PROTONIX) 40 mg/20 mL oral suspension 40 mg  40 mg Per Tube Daily [place], MD   40 mg at 07/28/16 8937  . vancomycin (VANCOCIN) IVPB 1000 mg/200 mL premix  1,000 mg Intravenous Q12H Donalynn Furlong Wallace, RPH   1,000 mg at 07/28/16 1513     Discharge Medications: Please see discharge summary for a list of discharge medications.  Relevant Imaging Results:  Relevant Lab Results:   Additional Information SSN: [place], [place]

## 2016-07-28 NOTE — Progress Notes (Signed)
[place] TEAM 1 - Stepdown/ICU TEAM  Tony [name]  HEN:[id number] DOB: 01-26-1946 DOA: 07/23/2016 PCP: Pcp Not In System    Brief Narrative:  71 year old male with previous cardiac arrest tx at [facility] that required a trach/peg but evidently was neurologically intact.  Patient suffered a cardiac arrest at [facility] with 2 rounds of CPR at [place] and one round at [facility]  They were unable to move air after the third code and decision was made to intubate from above and remove trach.  EDP communicated with sister who is his main decision maker and full code status confirmed.  Patient remained completely unresponsive and in refractory shock post third code and PCCM was called to admit.  Subjective: Resting comfortably in bed with no complaints.  Enjoyed watching the Super Bowl last night.  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  Weaning per PCCM   MRSA RLL Pneumonia  Remains on Vanc - will d/c after 7 full days of tx   Anemia No evidence of bleeding - felt to be due to critical illness - hemoglobin is holding steady - transfuse as needed to keep hemoglobin greater than 7.9  Recent Labs Lab 07/24/16 0440 07/25/16 0449 07/26/16 0555 07/27/16 0628 07/28/16 0426  HGB 7.0* 7.1* 8.0* 8.4* 8.3*    CAD s/p BMS to RCA in 2008 - MI/arrest 05/27/16 tx at [place] has seen in consultation - this arrest was felt to have been respiratory in etiology  HTN Blood pressure controlled  DM CBG well controlled   COPD on home O2 Well compensated at present  Hypokalemia Improving with supplementation - magnesium is acceptable  PVD [facility] notes confirm B iliac stents which require continued use of Plavix - have resumed   DVT prophylaxis: [place] heparin  Code Status: FULL CODE Family  Communication: no family present at time of exam  Disposition Plan: SDU on vent   Consultants:  PCCM [facility]   Antimicrobials:  Zosyn 1/31 > 2/2 Vancomycin 1/31 >  Objective: Blood pressure (!) 159/85, pulse 62, temperature 98.7 F (37.1 C), temperature source Oral, resp. rate 20, height 5' 8.9" (1.75 m), weight 95.8 kg (211 lb 3.2 oz), SpO2 100 %.  Intake/Output Summary (Last 24 hours) at 07/28/16 1543 Last data filed at 07/28/16 1500  Gross per 24 hour  Intake             1360 ml  Output             3675 ml  Net            -2315 ml   Filed Weights   07/25/16 0157 07/26/16 0155 07/28/16 0415  Weight: 96.4 kg (212 lb 8.4 oz) 94.4 kg (208 lb 3.2 oz) 95.8 kg (211 lb 3.2 oz)    Examination: General: No acute respiratory distress - alert  Lungs: Clear to auscultation bilaterally  Cardiovascular: Regular rate and rhythm  Abdomen: Nontender, nondistended, soft Extremities: No significant edema bilateral lower extremities  CBC:  Recent Labs Lab 07/23/16 1044 07/23/16 1540  07/24/16 0440 07/25/16 0449 07/26/16 0555 07/27/16 0628 07/28/16 0426  WBC 12.1* 21.1*  < > 9.7 9.8 11.2* 13.3* 12.2*  NEUTROABS 9.7* 18.0*  --   --   --   --   --   --  HGB 8.5* 9.9*  < > 7.0* 7.1* 8.0* 8.4* 8.3*  HCT 28.8* 33.0*  < > 22.2* 22.4* 26.0* 27.1* 26.8*  MCV 93.8 91.9  < > 87.4 89.6 90.3 90.6 90.8  PLT 307 342  < > 236 207 251 246 262  < > = values in this interval not displayed. Basic Metabolic Panel:  Recent Labs Lab 07/24/16 0440 07/24/16 0944 07/24/16 1610 07/25/16 0449 07/25/16 1700 07/26/16 0555 07/27/16 0628 07/28/16 0426  NA 136  --  138 140 142 141 141 138  K 3.1*  --  3.3* 2.9* 3.9 3.3* 3.3* 3.7  CL 100*  --  102 104 106 106 105 105  CO2 25  --  29 27 29 28 28 28   GLUCOSE 165*  --  121* 119* 110* 110* 121* 177*  BUN 14  --  18 19 16 17 20  21*  CREATININE 0.92  --  0.99 0.83 0.84 0.78 0.87 0.84  CALCIUM 7.5*  --  8.2* 7.9* 8.2* 8.4* 8.8* 8.7*  MG 1.5*  2.0 2.1 1.9 1.9  --   --  1.7  PHOS 2.6 2.4* 2.1* 1.8* 3.3  --   --   --    GFR: Estimated Creatinine Clearance: 96 mL/min (by C-G formula based on SCr of 0.84 mg/dL).  Liver Function Tests:  Recent Labs Lab 07/23/16 1044 07/23/16 1540 07/27/16 0628 07/28/16 0426  AST 60* 53* 27 30  ALT 22 29 21 26   ALKPHOS 70 86 61 63  BILITOT 0.8 0.8 0.9 1.1  PROT 6.4* 7.7 7.1 7.2  ALBUMIN 2.0* 2.4* 2.4* 2.4*    Recent Labs Lab 07/23/16 1044  LIPASE 21   Coagulation Profile:  Recent Labs Lab 07/23/16 1540  INR 1.24   CBG:  Recent Labs Lab 07/28/16 0020 07/28/16 0406 07/28/16 0805 07/28/16 1300 07/28/16 1540  GLUCAP 107* 107* 108* 135* 108*    Recent Results (from the past 240 hour(s))  Urine culture     Status: Abnormal   Collection Time: 07/23/16 11:26 AM  Result Value Ref Range Status   Specimen Description URINE, RANDOM  Final   Special Requests NONE  Final   Culture MULTIPLE SPECIES PRESENT, SUGGEST RECOLLECTION (A)  Final   Report Status 07/24/2016 FINAL  Final  Culture, blood (Routine X 2) w Reflex to ID Panel     Status: None   Collection Time: 07/23/16  2:44 PM  Result Value Ref Range Status   Specimen Description BLOOD BLOOD LEFT HAND  Final   Special Requests IN PEDIATRIC BOTTLE 1CC  Final   Culture NO GROWTH 5 DAYS  Final   Report Status 07/28/2016 FINAL  Final  Culture, blood (Routine X 2) w Reflex to ID Panel     Status: None   Collection Time: 07/23/16  2:44 PM  Result Value Ref Range Status   Specimen Description BLOOD BLOOD LEFT HAND  Final   Special Requests IN PEDIATRIC BOTTLE 1CC  Final   Culture NO GROWTH 5 DAYS  Final   Report Status 07/28/2016 FINAL  Final  MRSA PCR Screening     Status: None   Collection Time: 07/23/16  3:04 PM  Result Value Ref Range Status   MRSA by PCR NEGATIVE NEGATIVE Final    Comment:        The GeneXpert MRSA Assay (FDA approved for NASAL specimens only), is one component of a comprehensive MRSA  colonization surveillance program. It is not intended to diagnose MRSA infection nor to  guide or monitor treatment for MRSA infections.   Culture, respiratory (NON-Expectorated)     Status: None   Collection Time: 07/23/16  3:50 PM  Result Value Ref Range Status   Specimen Description TRACHEAL ASPIRATE  Final   Special Requests NONE  Final   Gram Stain   Final    FEW WBC PRESENT,BOTH PMN AND MONONUCLEAR RARE GRAM POSITIVE COCCI    Culture   Final    MODERATE METHICILLIN RESISTANT STAPHYLOCOCCUS AUREUS   Report Status 07/26/2016 FINAL  Final   Organism ID, Bacteria METHICILLIN RESISTANT STAPHYLOCOCCUS AUREUS  Final      Susceptibility   Methicillin resistant staphylococcus aureus - MIC*    CIPROFLOXACIN >=8 RESISTANT Resistant     ERYTHROMYCIN >=8 RESISTANT Resistant     GENTAMICIN <=0.5 SENSITIVE Sensitive     OXACILLIN >=4 RESISTANT Resistant     TETRACYCLINE <=1 SENSITIVE Sensitive     VANCOMYCIN 1 SENSITIVE Sensitive     TRIMETH/SULFA <=10 SENSITIVE Sensitive     CLINDAMYCIN <=0.25 SENSITIVE Sensitive     RIFAMPIN <=0.5 SENSITIVE Sensitive     Inducible Clindamycin NEGATIVE Sensitive     * MODERATE METHICILLIN RESISTANT STAPHYLOCOCCUS AUREUS     Scheduled Meds: . albuterol  2.5 mg Nebulization Q4H  . aspirin  81 mg Per Tube Daily  . chlorhexidine gluconate (MEDLINE KIT)  15 mL Mouth Rinse BID  . clopidogrel  75 mg Per Tube Daily  . feeding supplement (PRO-STAT SUGAR FREE 64)  30 mL Per Tube QID  . feeding supplement (VITAL HIGH PROTEIN)  1,000 mL Per Tube Q24H  . furosemide  40 mg Oral Daily  . heparin  5,000 Units Subcutaneous Q8H  . insulin aspart  0-15 Units Subcutaneous Q4H  . levothyroxine  125 mcg Per Tube QAC breakfast  . mouth rinse  15 mL Mouth Rinse QID  . pantoprazole sodium  40 mg Per Tube Daily  . vancomycin  1,000 mg Intravenous Q12H     LOS: 5 days   Cherene Altes, MD [facility] Office  [phone] Pager - Text Page per Amion as  per below:  On-Call/Text Page:      [email or web address]      password [email or web address]  If 7PM-7AM, please contact night-coverage [email or web address] Password [email or web address] 07/28/2016, 3:43 PM

## 2016-07-29 DIAGNOSIS — J9611 Chronic respiratory failure with hypoxia: Secondary | ICD-10-CM

## 2016-07-29 DIAGNOSIS — J15212 Pneumonia due to Methicillin resistant Staphylococcus aureus: Secondary | ICD-10-CM

## 2016-07-29 DIAGNOSIS — J431 Panlobular emphysema: Secondary | ICD-10-CM | POA: Diagnosis present

## 2016-07-29 LAB — BASIC METABOLIC PANEL
Anion gap: 9 (ref 5–15)
BUN: 25 mg/dL — ABNORMAL HIGH (ref 6–20)
CHLORIDE: 104 mmol/L (ref 101–111)
CO2: 28 mmol/L (ref 22–32)
CREATININE: 0.86 mg/dL (ref 0.61–1.24)
Calcium: 8.9 mg/dL (ref 8.9–10.3)
GFR calc non Af Amer: >60 mL/min (ref >60)
Glucose, Bld: 114 mg/dL — ABNORMAL HIGH (ref 65–99)
POTASSIUM: 3.9 mmol/L (ref 3.5–5.1)
SODIUM: 141 mmol/L (ref 135–145)

## 2016-07-29 LAB — MAGNESIUM: Magnesium: 1.7 mg/dL (ref 1.7–2.4)

## 2016-07-29 LAB — GLUCOSE, CAPILLARY
GLUCOSE-CAPILLARY: 72 mg/dL (ref 65–99)
Glucose-Capillary: 103 mg/dL — ABNORMAL HIGH (ref 65–99)
Glucose-Capillary: 113 mg/dL — ABNORMAL HIGH (ref 65–99)
Glucose-Capillary: 102 mg/dL — ABNORMAL HIGH (ref 65–99)
Glucose-Capillary: 123 mg/dL — ABNORMAL HIGH (ref 65–99)

## 2016-07-29 LAB — CBC
HEMATOCRIT: 27.7 % — AB (ref 39.0–52.0)
HEMOGLOBIN: 8.4 g/dL — AB (ref 13.0–17.0)
MCH: 27.8 pg (ref 26.0–34.0)
MCHC: 30.3 g/dL (ref 30.0–36.0)
MCV: 91.7 fL (ref 78.0–100.0)
Platelets: 260 10*3/uL (ref 150–400)
RBC: 3.02 MIL/uL — AB (ref 4.22–5.81)
RDW: 16.2 % — ABNORMAL HIGH (ref 11.5–15.5)
WBC: 12.2 10*3/uL — AB (ref 4.0–10.5)

## 2016-07-29 LAB — LIPID PANEL
Cholesterol: 112 mg/dL (ref 0–200)
HDL: 31 mg/dL — ABNORMAL LOW (ref >40)
LDL Cholesterol: 64 mg/dL (ref 0–99)
Total CHOL/HDL Ratio: 3.6 RATIO
Triglycerides: 84 mg/dL (ref <150)
VLDL: 17 mg/dL (ref 0–40)

## 2016-07-29 NOTE — Progress Notes (Signed)
PROGRESS NOTE    Tony [name]  JTT:[id number] DOB: Nov 15, 1945 DOA: 07/23/2016 PCP: Pcp Not In System   Brief Narrative:  71 year old BM  PMHX Cardiac Arrest tx at [place] that required a trach/peg but evidently was neurologically intact. Patient suffered a Cardiac Arrest at [facility] with 2 rounds of CPR at [place] and one round at [facility]. They were unable to move air after the third code and decision was made to intubate from above and remove trach. EDP communicated with sister who is hismain decision maker and full code status confirmed. Patient remained completely unresponsive and in refractory shock post third code and PCCM was called to admit.   Subjective: 2/6 A/O 4, patient able to talk over the trach. Patient very anxious to be off the ventilator permanently.     Assessment & Plan:   Active Problems:   Cardiac arrest ([place])   HCAP (healthcare-associated pneumonia)   S/P PEA cardiac arrest 2/2 respiratory arrest due to mucous plug in trach  -Appears to be stabilizing nicely - alert and conversant  - weaning trials per PCCM --2/6 request for speech PMV trial in A.m. [facility] M did not see patient today by the time of this note if they do not see in the a.m. would curbside appears to me that patient could be titrated off vent prior to discharge. Do they agree?  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  -Out of bed to chair q shift  MRSA RLL Pneumonia  -Completed 7 full days of tx   Anemia -No evidence of bleeding - felt to be due to critical illness - hemoglobin is holding steady  -- transfuse for hemoglobin <8  Recent Labs Lab 07/24/16 0440 07/25/16 0449 07/26/16 0555 07/27/16 0628 07/28/16 0426 07/29/16 0800  HGB 7.0* 7.1* 8.0* 8.4* 8.3* 8.4*   CAD s/p BMS to RCA in 2008 - MI/arrest 05/27/16 tx at [place] has seen in consultation - this arrest was felt to have been respiratory in  etiology  HTN -Blood pressure controlled  DM type 2 -A1c pending -Lipid panel pending -CBG well controlled   Chronic respiratory failure with hypoxia/COPD on home O2 Well compensated at present  Hypokalemia Improving with supplementation - magnesium is acceptable  PVD -[facility] notes confirm B iliac stents which require continued use of Plavix - have resumed   Goals of care -2/6 Spoke with LCSW Eliezer Lofts will decide within the next 24- 48 hours if patient can leave facility on trach collar working toward decannulation or if he must return to vent SNF   DVT prophylaxis: Subcutaneous heparin Code Status: Full Family Communication: None Disposition Plan: SNF on vent?   Consultants:  [facility]    Procedures/Significant Events:     VENTILATOR SETTINGS: Daytime: Patient tolerating trach collar   Nighttime Mode: PRVC Rate: 20 Tidal volume: 8 FiO2: 100 PEEP: 5   Cultures 1/31 urine is a multiple species 1/31 blood negative final 1/31 tracheal aspirate positive MRSA     Antimicrobials: Anti-infectives    Start     Stop   07/29/16 0300  vancomycin (VANCOCIN) IVPB 1000 mg/200 mL premix     07/29/16 1628   07/27/16 0300  vancomycin (VANCOCIN) IVPB 1000 mg/200 mL premix  Status:  Discontinued     07/28/16 1720   07/24/16 0230  vancomycin (VANCOCIN) IVPB 750 mg/150 ml premix  Status:  Discontinued     07/26/16 1558  07/24/16 0000  piperacillin-tazobactam (ZOSYN) IVPB 3.375 g  Status:  Discontinued     07/25/16 0840   07/23/16 1430  vancomycin (VANCOCIN) 1,750 mg in sodium chloride 0.9 % 500 mL IVPB     07/23/16 1730   07/23/16 1400  vancomycin (VANCOCIN) IVPB 1000 mg/200 mL premix  Status:  Discontinued     07/23/16 1500   07/23/16 1330  piperacillin-tazobactam (ZOSYN) IVPB 3.375 g     07/23/16 1827       Devices    LINES / TUBES:  #6 cuffed trach>>    Continuous Infusions:   Objective: Vitals:   07/29/16 0200 07/29/16 0451  07/29/16 0500 07/29/16 0700  BP: 117/71 120/64  136/76  Pulse: 85 87  79  Resp: 20 20  20   Temp:  98 F (36.7 C)  98 F (36.7 C)  TempSrc:  Oral  Oral  SpO2: 97% 96% 94%   Weight:  97.1 kg (214 lb)    Height:        Intake/Output Summary (Last 24 hours) at 07/29/16 0754 Last data filed at 07/29/16 0700  Gross per 24 hour  Intake             1200 ml  Output             1425 ml  Net             -225 ml   Filed Weights   07/26/16 0155 07/28/16 0415 07/29/16 0451  Weight: 94.4 kg (208 lb 3.2 oz) 95.8 kg (211 lb 3.2 oz) 97.1 kg (214 lb)    Examination:  General: A/Ox 4, positive acute respiratory distress Eyes: negative scleral hemorrhage, negative anisocoria, negative icterus ENT: Negative Runny nose, negative gingival bleeding, Neck:  Negative scars, masses, torticollis, lymphadenopathy, JVD, #6 cuffed trach in place negative sign of infection minimal discharge Lungs: Clear to auscultation bilaterally without wheezes or crackles Cardiovascular: Regular rate and rhythm without murmur gallop or rub normal S1 and S2 Abdomen: negative abdominal pain, nondistended, positive soft, bowel sounds, no rebound, no ascites, no appreciable mass Extremities: No significant cyanosis, clubbing, or edema bilateral lower extremities Skin: Negative rashes, lesions, ulcers Psychiatric:  Negative depression, negative anxiety, negative fatigue, negative mania  Central nervous system:  Cranial nerves II through XII intact, tongue/uvula midline, all extremities muscle strength 5/5, sensation intact throughout, negative dysarthria, negative expressive aphasia, negative receptive aphasia.  .     Data Reviewed: Care during the described time interval was provided by me .  I have reviewed this patient's available data, including medical history, events of note, physical examination, and all test results as part of my evaluation. I have personally reviewed and interpreted all radiology  studies.  CBC:  Recent Labs Lab 07/23/16 1044 07/23/16 1540  07/24/16 0440 07/25/16 0449 07/26/16 0555 07/27/16 0628 07/28/16 0426  WBC 12.1* 21.1*  < > 9.7 9.8 11.2* 13.3* 12.2*  NEUTROABS 9.7* 18.0*  --   --   --   --   --   --   HGB 8.5* 9.9*  < > 7.0* 7.1* 8.0* 8.4* 8.3*  HCT 28.8* 33.0*  < > 22.2* 22.4* 26.0* 27.1* 26.8*  MCV 93.8 91.9  < > 87.4 89.6 90.3 90.6 90.8  PLT 307 342  < > 236 207 251 246 262  < > = values in this interval not displayed. Basic Metabolic Panel:  Recent Labs Lab 07/24/16 0440 07/24/16 9417 07/24/16 1610 07/25/16 0449 07/25/16 1700 07/26/16 0555 07/27/16 4081  07/28/16 0426  NA 136  --  138 140 142 141 141 138  K 3.1*  --  3.3* 2.9* 3.9 3.3* 3.3* 3.7  CL 100*  --  102 104 106 106 105 105  CO2 25  --  29 27 29 28 28 28   GLUCOSE 165*  --  121* 119* 110* 110* 121* 177*  BUN 14  --  18 19 16 17 20  21*  CREATININE 0.92  --  0.99 0.83 0.84 0.78 0.87 0.84  CALCIUM 7.5*  --  8.2* 7.9* 8.2* 8.4* 8.8* 8.7*  MG 1.5* 2.0 2.1 1.9 1.9  --   --  1.7  PHOS 2.6 2.4* 2.1* 1.8* 3.3  --   --   --    GFR: Estimated Creatinine Clearance: 96.5 mL/min (by C-G formula based on SCr of 0.84 mg/dL). Liver Function Tests:  Recent Labs Lab 07/23/16 1044 07/23/16 1540 07/27/16 0628 07/28/16 0426  AST 60* 53* 27 30  ALT 22 29 21 26   ALKPHOS 70 86 61 63  BILITOT 0.8 0.8 0.9 1.1  PROT 6.4* 7.7 7.1 7.2  ALBUMIN 2.0* 2.4* 2.4* 2.4*    Recent Labs Lab 07/23/16 1044  LIPASE 21   No results for input(s): AMMONIA in the last 168 hours. Coagulation Profile:  Recent Labs Lab 07/23/16 1540  INR 1.24   Cardiac Enzymes: No results for input(s): CKTOTAL, CKMB, CKMBINDEX, TROPONINI in the last 168 hours. BNP (last 3 results) No results for input(s): PROBNP in the last 8760 hours. HbA1C: No results for input(s): HGBA1C in the last 72 hours. CBG:  Recent Labs Lab 07/28/16 1540 07/28/16 2027 07/28/16 2338 07/29/16 0450 07/29/16 0740  GLUCAP 108* 94  109* 103* 113*   Lipid Profile: No results for input(s): CHOL, HDL, LDLCALC, TRIG, CHOLHDL, LDLDIRECT in the last 72 hours. Thyroid Function Tests: No results for input(s): TSH, T4TOTAL, FREET4, T3FREE, THYROIDAB in the last 72 hours. Anemia Panel: No results for input(s): VITAMINB12, FOLATE, FERRITIN, TIBC, IRON, RETICCTPCT in the last 72 hours. Urine analysis:    Component Value Date/Time   COLORURINE YELLOW 07/23/2016 1126   APPEARANCEUR CLOUDY (A) 07/23/2016 1126   LABSPEC >1.030 (H) 07/23/2016 1126   PHURINE 5.5 07/23/2016 1126   GLUCOSEU NEGATIVE 07/23/2016 1126   HGBUR LARGE (A) 07/23/2016 [place] 07/23/2016 [place] 07/23/2016 1126   PROTEINUR 100 (A) 07/23/2016 1126   NITRITE NEGATIVE 07/23/2016 1126   LEUKOCYTESUR SMALL (A) 07/23/2016 1126   Sepsis Labs: @LABRCNTIP (procalcitonin:4,lacticidven:4)  ) Recent Results (from the past 240 hour(s))  Urine culture     Status: Abnormal   Collection Time: 07/23/16 11:26 AM  Result Value Ref Range Status   Specimen Description URINE, RANDOM  Final   Special Requests NONE  Final   Culture MULTIPLE SPECIES PRESENT, SUGGEST RECOLLECTION (A)  Final   Report Status 07/24/2016 FINAL  Final  Culture, blood (Routine X 2) w Reflex to ID Panel     Status: None   Collection Time: 07/23/16  2:44 PM  Result Value Ref Range Status   Specimen Description BLOOD BLOOD LEFT HAND  Final   Special Requests IN PEDIATRIC BOTTLE [place]  Final   Culture NO GROWTH 5 DAYS  Final   Report Status 07/28/2016 FINAL  Final  Culture, blood (Routine X 2) w Reflex to ID Panel     Status: None   Collection Time: 07/23/16  2:44 PM  Result Value Ref Range Status   Specimen Description  BLOOD BLOOD LEFT HAND  Final   Special Requests IN PEDIATRIC BOTTLE 1CC  Final   Culture NO GROWTH 5 DAYS  Final   Report Status 07/28/2016 FINAL  Final  MRSA PCR Screening     Status: None   Collection Time: 07/23/16  3:04 PM  Result Value  Ref Range Status   MRSA by PCR NEGATIVE NEGATIVE Final    Comment:        The GeneXpert MRSA Assay (FDA approved for NASAL specimens only), is one component of a comprehensive MRSA colonization surveillance program. It is not intended to diagnose MRSA infection nor to guide or monitor treatment for MRSA infections.   Culture, respiratory (NON-Expectorated)     Status: None   Collection Time: 07/23/16  3:50 PM  Result Value Ref Range Status   Specimen Description TRACHEAL ASPIRATE  Final   Special Requests NONE  Final   Gram Stain   Final    FEW WBC PRESENT,BOTH PMN AND MONONUCLEAR RARE GRAM POSITIVE COCCI    Culture   Final    MODERATE METHICILLIN RESISTANT STAPHYLOCOCCUS AUREUS   Report Status 07/26/2016 FINAL  Final   Organism ID, Bacteria METHICILLIN RESISTANT STAPHYLOCOCCUS AUREUS  Final      Susceptibility   Methicillin resistant staphylococcus aureus - MIC*    CIPROFLOXACIN >=8 RESISTANT Resistant     ERYTHROMYCIN >=8 RESISTANT Resistant     GENTAMICIN <=0.5 SENSITIVE Sensitive     OXACILLIN >=4 RESISTANT Resistant     TETRACYCLINE <=1 SENSITIVE Sensitive     VANCOMYCIN 1 SENSITIVE Sensitive     TRIMETH/SULFA <=10 SENSITIVE Sensitive     CLINDAMYCIN <=0.25 SENSITIVE Sensitive     RIFAMPIN <=0.5 SENSITIVE Sensitive     Inducible Clindamycin NEGATIVE Sensitive     * MODERATE METHICILLIN RESISTANT STAPHYLOCOCCUS AUREUS         Radiology Studies: No results found.      Scheduled Meds: . albuterol  2.5 mg Nebulization QID  . aspirin  81 mg Per Tube Daily  . chlorhexidine gluconate (MEDLINE KIT)  15 mL Mouth Rinse BID  . clopidogrel  75 mg Per Tube Daily  . feeding supplement (PRO-STAT SUGAR FREE 64)  30 mL Per Tube QID  . feeding supplement (VITAL HIGH PROTEIN)  1,000 mL Per Tube Q24H  . furosemide  40 mg Oral Daily  . heparin  5,000 Units Subcutaneous Q8H  . insulin aspart  0-15 Units Subcutaneous Q4H  . levothyroxine  125 mcg Per Tube QAC breakfast  .  mouth rinse  15 mL Mouth Rinse QID  . pantoprazole sodium  40 mg Per Tube Daily  . potassium chloride  40 mEq Per Tube Daily  . vancomycin  1,000 mg Intravenous Q12H   Continuous Infusions:   LOS: 6 days    Time spent: 40 minutes    Teron Blais, Geraldo Docker, MD [facility] Pager [phone]   If 7PM-7AM, please contact night-coverage [email or web address] Password [facility] 07/29/2016, 7:54 AM

## 2016-07-29 NOTE — Progress Notes (Addendum)
RT placed pt on 40% ATC due to pt stable. Pt tol well. Pt in no distress

## 2016-07-30 LAB — CBC
HEMATOCRIT: 28.2 % — AB (ref 39.0–52.0)
Hemoglobin: 8.7 g/dL — ABNORMAL LOW (ref 13.0–17.0)
MCH: 28.3 pg (ref 26.0–34.0)
MCHC: 30.9 g/dL (ref 30.0–36.0)
MCV: 91.9 fL (ref 78.0–100.0)
PLATELETS: 292 10*3/uL (ref 150–400)
RBC: 3.07 MIL/uL — ABNORMAL LOW (ref 4.22–5.81)
RDW: 16.6 % — ABNORMAL HIGH (ref 11.5–15.5)
WBC: 13.1 10*3/uL — AB (ref 4.0–10.5)

## 2016-07-30 LAB — GLUCOSE, CAPILLARY
GLUCOSE-CAPILLARY: 118 mg/dL — AB (ref 65–99)
Glucose-Capillary: 105 mg/dL — ABNORMAL HIGH (ref 65–99)
Glucose-Capillary: 114 mg/dL — ABNORMAL HIGH (ref 65–99)
Glucose-Capillary: 97 mg/dL (ref 65–99)

## 2016-07-30 LAB — BASIC METABOLIC PANEL
ANION GAP: 10 (ref 5–15)
BUN: 33 mg/dL — ABNORMAL HIGH (ref 6–20)
CHLORIDE: 102 mmol/L (ref 101–111)
CO2: 29 mmol/L (ref 22–32)
Calcium: 9.1 mg/dL (ref 8.9–10.3)
Creatinine, Ser: 0.95 mg/dL (ref 0.61–1.24)
GFR calc non Af Amer: >60 mL/min (ref >60)
GLUCOSE: 110 mg/dL — AB (ref 65–99)
Potassium: 3.5 mmol/L (ref 3.5–5.1)
Sodium: 141 mmol/L (ref 135–145)

## 2016-07-30 LAB — MAGNESIUM: Magnesium: 1.8 mg/dL (ref 1.7–2.4)

## 2016-07-30 MED ORDER — ALBUTEROL SULFATE (2.5 MG/3ML) 0.083% IN NEBU: 2.5000 mg | INHALATION_SOLUTION | RESPIRATORY_TRACT | 12 refills | Status: AC | PRN

## 2016-07-30 MED ORDER — POTASSIUM CHLORIDE 20 MEQ/15ML (10%) PO SOLN: 40.0000 meq | Freq: Every day | ORAL | 0 refills | Status: AC

## 2016-07-30 MED ORDER — ALBUTEROL SULFATE (2.5 MG/3ML) 0.083% IN NEBU: 2.5000 mg | INHALATION_SOLUTION | Freq: Three times a day (TID) | RESPIRATORY_TRACT | 12 refills | Status: AC

## 2016-07-30 MED ORDER — VITAL HIGH PROTEIN PO LIQD: 1000.0000 mL | ORAL | Status: AC

## 2016-07-30 MED ORDER — METOPROLOL TARTRATE 25 MG PO TABS: 25.0000 mg | ORAL_TABLET | Freq: Two times a day (BID) | ORAL | Status: AC

## 2016-07-30 MED ORDER — ALBUTEROL SULFATE (2.5 MG/3ML) 0.083% IN NEBU
2.5000 mg | INHALATION_SOLUTION | Freq: Three times a day (TID) | RESPIRATORY_TRACT | Status: DC
  Administered 2016-07-30 (×2): 2.5 mg via RESPIRATORY_TRACT
  Filled 2016-07-30 (×2): qty 3

## 2016-07-30 MED ORDER — PRO-STAT SUGAR FREE PO LIQD: 30.0000 mL | Freq: Four times a day (QID) | ORAL | 0 refills | Status: AC

## 2016-07-30 MED ORDER — NAPHAZOLINE-GLYCERIN 0.012-0.2 % OP SOLN: 1.0000 [drp] | Freq: Four times a day (QID) | OPHTHALMIC | 0 refills | Status: AC | PRN

## 2016-07-30 NOTE — Progress Notes (Signed)
[facility].

## 2016-07-30 NOTE — Progress Notes (Signed)
RT placed pt on 30% ATC- pt tol well

## 2016-07-30 NOTE — Evaluation (Signed)
Passy-Muir Speaking Valve - Evaluation Patient Details  Name: Tony [name] MRN: [id number] Date of Birth: Jul 11, 1945  Today's Date: 07/30/2016 Time: 1010-1054 SLP Time Calculation (min) (ACUTE ONLY): 44 min  Past Medical History:  Past Medical History:  Diagnosis Date  . Atrial flutter (HCC)    s/p ablation 2017  . CAD (coronary artery disease)    /22/2008 Cardiac cath: left main 10% ostial, LAD mid 10%, LCx mid 20%, RCA mid 70%. Direct RCA- stenting with 3.0/18 Driver BMS  . COPD (chronic obstructive pulmonary disease) (HCC)   . Coronary artery disease   . Diabetes mellitus without complication (HCC)   . Hypertension   . PVD (peripheral vascular disease) (HCC)    a. 08/14/2006 Abdominal aortography with bilateral lower extremity runoff: high grade right CIA stenosis, total occlusion of left SFA with reconstitution via bridging collateral from PFA with 2-vessel run-off below the knee on the right, and 1.5 vessel run-off below the knee on the left. b. 08/25/2006 Angioplasty and stenting of bilateral CIAs/aortoiliac bifurcation with 8 x 57 mm stents c. 12/12   . Thyroid disease    Past Surgical History: History reviewed. No pertinent surgical history. HPI:  71 year old BM PMHX Cardiac Arresttx at [facility] required a trach/peg but evidently was neurologically intact. Patient suffered a Cardiac Arrest at [facility] 2 rounds of CPR at [facility] and one round at [facility]. They were unable to move air after the third code and decision was made to intubate from above and remove trach. Pt now documented to have had a mucous plug. Has been on the vent until this am, day of eval.    Assessment / Plan / Recommendation Clinical Impression  Pt demosntrates excellent redirection of air to upper airway with PMSV. Breath support somewhat decreased with min verbal cues needed for pt to increase volume with conversation. Pt was able to tolerate placement for 5 and 15 minute intervals. Regardless of placement,  pts respiratory rate sometimes increased to 40, though pt denies sensation of distress. O2 saturations remained between 88 and 95 and pt states on the ventilator he typically stays around 92. Again worsened function was not related to PMSV placement as pt dropped to a rate of 25 for a period of time with conversation and also increased to 40 when pt was not wearing the valve. Would like to see the pts respiratory function more stable before allowing prolonged use of valve. For now, pt may wear PMSV with full staff supervision for communication purposes with staff and if family visits. Hopeful for improved function.     SLP Assessment  Patient needs continued Speech Lanaguage Pathology Services    Follow Up Recommendations  Inpatient Rehab    Frequency and Duration min 2x/week  2 weeks    PMSV Trial PMSV was placed for: 15 minutes Able to redirect subglottic air through upper airway: Yes Able to Attain Phonation: Yes Voice Quality: Low vocal intensity Able to Expectorate Secretions: Yes Level of Secretion Expectoration with PMSV: Oral Breath Support for Phonation: Mildly decreased Intelligibility: Intelligible Respirations During Trial: 25 (40) SpO2 During Trial: 92 % (88) Behavior: Alert;Cooperative;Expresses self well   Tracheostomy Tube  Additional Tracheostomy Tube Assessment Trach Collar Period:  (a few hours yesterday and am today) Secretion Description: bloody, thin Level of Secretion Expectoration: Tracheal    Vent Dependency  Vent Dependent: Yes FiO2 (%): 40 % Nocturnal Vent: Yes    Cuff Deflation Trial  GO Tolerated Cuff Deflation: Yes Length of Time for Cuff  Deflation Trial: 40minutes Behavior: Alert;Cooperative;Expresses self well        Hazelee Harbold, Riley NearingBonnie Caroline 07/30/2016, 11:07 AM

## 2016-07-30 NOTE — Evaluation (Signed)
Clinical/Bedside Swallow Evaluation Patient Details  Name: Apolinar JunesWillie Minor MRN: [id number] Date of Birth: 18-Sep-1945  Today's Date: 07/30/2016 Time: SLP Start Time (ACUTE ONLY): 1010 SLP Stop Time (ACUTE ONLY): 1040 SLP Time Calculation (min) (ACUTE ONLY): 30 min  Past Medical History:  Past Medical History:  Diagnosis Date  . Atrial flutter (HCC)    s/p ablation 2017  . CAD (coronary artery disease)    /22/2008 Cardiac cath: left main 10% ostial, LAD mid 10%, LCx mid 20%, RCA mid 70%. Direct RCA- stenting with 3.0/18 Driver BMS  . COPD (chronic obstructive pulmonary disease) (HCC)   . Coronary artery disease   . Diabetes mellitus without complication (HCC)   . Hypertension   . PVD (peripheral vascular disease) (HCC)    a. 08/14/2006 Abdominal aortography with bilateral lower extremity runoff: high grade right CIA stenosis, total occlusion of left SFA with reconstitution via bridging collateral from PFA with 2-vessel run-off below the knee on the right, and 1.5 vessel run-off below the knee on the left. b. 08/25/2006 Angioplasty and stenting of bilateral CIAs/aortoiliac bifurcation with 8 x 57 mm stents c. 12/12   . Thyroid disease    Past Surgical History: History reviewed. No pertinent surgical history. HPI:  71 year old [facility] but evidently was neurologically intact. [facility] They were unable to move air after the third code and decision was made to intubate from above and remove trach. Pt now documented to have had a mucous plug. Has been on the vent until this am, day of eval.    Assessment / Plan / Recommendation Clinical Impression  Pt demosntrates excellent potential for PO intake on trach collar. SLP did not identify any signs of dysphagia, though intake of thin liquids was minimal. Primary barrier is intermittently high respiratory rate  that would impact breathing swallowing pattern to put pt at risk of aspiration. Pt will need objective testing prior to diet initiation, but would want to see ongoing tolerance of time of the ventilator and more stable respiratory rate. Plan to f/u with pt as long as he is admitted. If pt to d/c to SNF, hopeful for availaility to FEES soon.     Aspiration Risk  Moderate aspiration risk    Diet Recommendation NPO        Other  Recommendations Oral Care Recommendations: Oral care QID Other Recommendations: Place PMSV during PO intake   Follow up Recommendations        Frequency and Duration min 2x/week  2 weeks       Prognosis Prognosis for Safe Diet Advancement: Good      Swallow Study   General HPI: 71 year old BM [facility] but evidently was neurologically intact. [facility] They were unable to move air after the third code and decision was made to intubate from above and remove trach. Pt now documented to have had a mucous plug. Has been on the vent until this am, day of eval.  Type of Study: Bedside Swallow Evaluation Previous Swallow Assessment: none Diet Prior to this Study: NPO;PEG tube Temperature Spikes Noted: No Respiratory Status: Trach;Trach Collar Trach Size and Type: With PMSV in place;Extra long;#6;Cuff;Deflated History of Recent Intubation: Yes Behavior/Cognition: Alert;Cooperative;Pleasant mood Oral Cavity Assessment: Within  Functional Limits Oral Care Completed by SLP: No Oral Cavity - Dentition: Adequate natural dentition Vision: Functional for self-feeding Self-Feeding Abilities: Able to feed self Patient Positioning: Upright in chair Baseline Vocal Quality: Low vocal intensity Volitional Cough: Strong Volitional Swallow: Able to elicit    Oral/Motor/Sensory Function Overall Oral Motor/Sensory Function: Within  functional limits   Ice Chips Ice chips: Within functional limits   Thin Liquid Thin Liquid: Within functional limits    Nectar Thick Nectar Thick Liquid: Not tested   Honey Thick Honey Thick Liquid: Not tested   Puree Puree: Not tested   Solid   GO   Solid: Not tested       Harlon Ditty, MA CCC-SLP [phone]  Paelyn Smick, Riley Nearing 07/30/2016,2:27 PM

## 2016-07-30 NOTE — Progress Notes (Signed)
Patient discharged back to [facility] after report called Rodman KeyMyria Williams.  Patient transported via [facility].

## 2016-07-30 NOTE — Discharge Summary (Signed)
DISCHARGE SUMMARY  Tony [name]  MR#: [id number]  DOB:11/03/1945  Date of Admission: 07/23/2016 Date of Discharge: 07/30/2016  Attending Physician:Kagen Kunath T  Patient's PCP:Pcp Not In System  Consults: PCCM [facility]   Disposition: D/C to [facility]  Follow-up Appts: Ongoing care will be provided by the MD of record at the patient's SNF   Tests Needing Follow-up: -swallow eval is suggested to determine if his diet can be resumed/advanced -ongoing vent weaning should be carried out -ongoing SLP therapy w/ use of PMV should be provided   Discharge Diagnoses: S/P PEA cardiac arrest 2/2 respiratory arrest due to mucous plug in trach  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 Patient suffered a cardiac arrest at [facility] with 2 rounds of CPR at [facility] and one round at [facility]. They were unable to move air after the third code and decision was made to intubate from above and remove trach. EDP communicated with sister who is hismain decision maker and full code status confirmed. Patient remained completely unresponsive and in refractory shock post third code and PCCM was called to admit.  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 2/2 Mucus Plug  Weaning per PCCM - doing well on vent   MRSA RLL Pneumonia  Completed 7 day course of Vancomycin   Anemia No evidence of bleeding - felt to be due to critical illness - hemoglobin holding steady at time  of d/c   CAD s/p BMS to RCA in 2008 - MI/arrest 05/27/16 tx at [facility] has seen in consultation - this arrest was felt to have been respiratory in etiology  HTN Blood pressure controlled  DM CBG well controlled   COPD on home O2 Well compensated at present - no wheezing at time of d/c   Hypokalemia Improved with supplementation - magnesium is acceptable  PVD [facility] notes confirm B iliac stents which require continued use of Plavix - have resumed   Allergies as of 07/30/2016      Reactions   Azithromycin Anaphylaxis   Fentanyl Anaphylaxis   Tolerated oxycodone at [facility]   Acetaminophen Other (See Comments)   angioedema   Morphine And Related Itching   Tolerated oxycodone at [facility]      Medication List    STOP taking these medications   clonazePAM 0.25 MG disintegrating tablet Commonly known as:  KLONOPIN   gabapentin 250 MG/5ML solution Commonly known as:  NEURONTIN   HYDRALAZINE HCL IJ   lidocaine 5 % Commonly known as:  LIDODERM   Melatonin 3 MG Tabs   phenol 1.4 % Liqd Commonly known as:  CHLORASEPTIC   SENNA S 8.6-50 MG tablet Generic drug:  senna-docusate   sodium bicarbonate 650 MG tablet   tizanidine 2 MG capsule Commonly known as:  ZANAFLEX     TAKE these medications   albuterol (2.5 MG/3ML) 0.083% nebulizer solution Commonly known as:  PROVENTIL Take 3 mLs (2.5 mg total) by nebulization every 2 (two) hours as  needed for wheezing or shortness of breath.   albuterol (2.5 MG/3ML) 0.083% nebulizer solution Commonly known as:  PROVENTIL Take 3 mLs (2.5 mg total) by nebulization 3 (three) times daily.   aspirin 81 MG chewable tablet Place 81 mg into feeding tube daily.   chlorhexidine 0.12 % solution Commonly known as:  PERIDEX Use as directed 15 mLs in the mouth or throat every 12 (twelve) hours.   clopidogrel 75 MG tablet Commonly known as:  PLAVIX Place 75 mg into feeding tube daily.   feeding supplement (PRO-STAT SUGAR  FREE 64) Liqd Place 30 mLs into feeding tube 4 (four) times daily.   feeding supplement (VITAL HIGH PROTEIN) Liqd liquid Place 1,000 mLs into feeding tube daily.   furosemide 40 MG tablet Commonly known as:  LASIX Place 40 mg into feeding tube daily.   heparin 5000 UNIT/ML injection Inject 5,000 Units into the skin every 8 (eight) hours.   insulin regular 250 units/2.27mL (100 units/mL) injection Commonly known as:  NOVOLIN R,HUMULIN R Inject 0-16 Units into the skin 3 (three) times daily before meals. Inject as per sliding scale if 70-150=0 for blood glucose 70mg /dl implement hypoglycemia protocol; 151-200=4 units, 201-250=8 units, 251-300=10 units, 301-350=12 units, 351-400=16 units, >400 give 16 units, notify MD, subcutaneously twice daily for DM2   levothyroxine 125 MCG tablet Commonly known as:  SYNTHROID, LEVOTHROID Place 125 mcg into feeding tube daily before breakfast.   metoprolol tartrate 25 MG tablet Commonly known as:  LOPRESSOR Place 1 tablet (25 mg total) into feeding tube 2 (two) times daily. What changed:  when to take this   naphazoline-glycerin 0.012-0.2 % Soln Commonly known as:  CLEAR EYES Place 1-2 drops into both eyes 4 (four) times daily as needed for irritation.   ondansetron 4 MG/5ML solution Commonly known as:  ZOFRAN Place 4 mg into feeding tube every 6 (six) hours as needed for nausea or vomiting.   oxyCODONE 5 MG immediate release tablet Commonly known as:  Oxy IR/ROXICODONE Place 5 mg into feeding tube every 6 (six) hours as needed for severe pain.   pantoprazole sodium 40 mg/20 mL Pack Commonly known as:  PROTONIX Place 40 mg into feeding tube daily.   polyethylene glycol packet Commonly known as:  MIRALAX / GLYCOLAX Place 17 g into feeding tube daily.   potassium chloride 20 MEQ/15ML (10%) Soln Place 30 mLs (40 mEq total) into feeding tube daily. Start taking on:  07/31/2016       Day of Discharge BP (!) 153/84   Pulse 91   Temp  98.4 F (36.9 C) (Oral)   Resp 20   Ht 5' 8.9" (1.75 m)   Wt 97.1 kg (214 lb)   SpO2 96%   BMI 31.70 kg/m   Physical Exam: General: No acute respiratory distress - speaking clearly w/ PMV Lungs: Clear to auscultation bilaterally without wheezes or crackles Cardiovascular: Regular rate and rhythm without murmur gallop or rub  Abdomen: Nontender, nondistended, soft, bowel sounds positive, no rebound, no ascites, no appreciable mass - PEG insertion clean and dry  Extremities: No significant cyanosis, clubbing, or edema bilateral lower extremities  Basic Metabolic Panel:  Recent Labs Lab 07/24/16 0440 07/24/16 0944 07/24/16 1610 07/25/16 0449 07/25/16 1700 07/26/16 0555 07/27/16 0628 07/28/16 0426 07/29/16 0800 07/30/16 0510  NA 136  --  138 140 142 141 141 138 141 141  K 3.1*  --  3.3* 2.9* 3.9 3.3* 3.3* 3.7 3.9 3.5  CL 100*  --  102 104  106 106 105 105 104 102  CO2 25  --  29 27 29 28 28 28 28 29   GLUCOSE 165*  --  121* 119* 110* 110* 121* 177* 114* 110*  BUN 14  --  18 19 16 17 20  21* 25* 33*  CREATININE 0.92  --  0.99 0.83 0.84 0.78 0.87 0.84 0.86 0.95  CALCIUM 7.5*  --  8.2* 7.9* 8.2* 8.4* 8.8* 8.7* 8.9 9.1  MG 1.5* 2.0 2.1 1.9 1.9  --   --  1.7 1.7 1.8  PHOS 2.6 2.4* 2.1* 1.8* 3.3  --   --   --   --   --     Liver Function Tests:  Recent Labs Lab 07/23/16 1540 07/27/16 0628 07/28/16 0426  AST 53* 27 30  ALT 29 21 26   ALKPHOS 86 61 63  BILITOT 0.8 0.9 1.1  PROT 7.7 7.1 7.2  ALBUMIN 2.4* 2.4* 2.4*    Coags:  Recent Labs Lab 07/23/16 1540  INR 1.24    CBC:  Recent Labs Lab 07/23/16 1540  07/26/16 0555 07/27/16 0628 07/28/16 0426 07/29/16 0800 07/30/16 0510  WBC 21.1*  < > 11.2* 13.3* 12.2* 12.2* 13.1*  NEUTROABS 18.0*  --   --   --   --   --   --   HGB 9.9*  < > 8.0* 8.4* 8.3* 8.4* 8.7*  HCT 33.0*  < > 26.0* 27.1* 26.8* 27.7* 28.2*  MCV 91.9  < > 90.3 90.6 90.8 91.7 91.9  PLT 342  < > 251 246 262 260 292  < > = values in this interval  not displayed.   CBG:  Recent Labs Lab 07/29/16 2027 07/30/16 0003 07/30/16 0339 07/30/16 0713 07/30/16 1200  GLUCAP 72 97 105* 118* 114*    Recent Results (from the past 240 hour(s))  Urine culture     Status: Abnormal   Collection Time: 07/23/16 11:26 AM  Result Value Ref Range Status   Specimen Description URINE, RANDOM  Final   Special Requests NONE  Final   Culture MULTIPLE SPECIES PRESENT, SUGGEST RECOLLECTION (A)  Final   Report Status 07/24/2016 FINAL  Final  Culture, blood (Routine X 2) w Reflex to ID Panel     Status: None   Collection Time: 07/23/16  2:44 PM  Result Value Ref Range Status   Specimen Description BLOOD BLOOD LEFT HAND  Final   Special Requests IN PEDIATRIC BOTTLE 1CC  Final   Culture NO GROWTH 5 DAYS  Final   Report Status 07/28/2016 FINAL  Final  Culture, blood (Routine X 2) w Reflex to ID Panel     Status: None   Collection Time: 07/23/16  2:44 PM  Result Value Ref Range Status   Specimen Description BLOOD BLOOD LEFT HAND  Final   Special Requests IN PEDIATRIC BOTTLE 1CC  Final   Culture NO GROWTH 5 DAYS  Final   Report Status 07/28/2016 FINAL  Final  MRSA PCR Screening     Status: None   Collection Time: 07/23/16  3:04 PM  Result Value Ref Range Status   MRSA by PCR NEGATIVE NEGATIVE Final    Comment:        The GeneXpert MRSA Assay (FDA approved for NASAL specimens only), is one component of a comprehensive MRSA colonization surveillance program. It is not intended to diagnose MRSA infection nor to guide or monitor treatment for MRSA infections.   Culture, respiratory (NON-Expectorated)     Status: None  Collection Time: 07/23/16  3:50 PM  Result Value Ref Range Status   Specimen Description TRACHEAL ASPIRATE  Final   Special Requests NONE  Final   Gram Stain   Final    FEW WBC PRESENT,BOTH PMN AND MONONUCLEAR RARE GRAM POSITIVE COCCI    Culture   Final    MODERATE METHICILLIN RESISTANT STAPHYLOCOCCUS AUREUS   Report Status  07/26/2016 FINAL  Final   Organism ID, Bacteria METHICILLIN RESISTANT STAPHYLOCOCCUS AUREUS  Final      Susceptibility   Methicillin resistant staphylococcus aureus - MIC*    CIPROFLOXACIN >=8 RESISTANT Resistant     ERYTHROMYCIN >=8 RESISTANT Resistant     GENTAMICIN <=0.5 SENSITIVE Sensitive     OXACILLIN >=4 RESISTANT Resistant     TETRACYCLINE <=1 SENSITIVE Sensitive     VANCOMYCIN 1 SENSITIVE Sensitive     TRIMETH/SULFA <=10 SENSITIVE Sensitive     CLINDAMYCIN <=0.25 SENSITIVE Sensitive     RIFAMPIN <=0.5 SENSITIVE Sensitive     Inducible Clindamycin NEGATIVE Sensitive     * MODERATE METHICILLIN RESISTANT STAPHYLOCOCCUS AUREUS     Time spent in discharge (includes decision making & examination of pt): 35 minutes  07/30/2016, 2:50 PM   Lonia Blood, MD [facility] Office  [phone] Pager [phone]  On-Call/Text Page:      [email or web address]      password [facility]

## 2016-07-30 NOTE — Progress Notes (Signed)
Increased FIO2 to 40% due to low sats. Pt in no distress

## 2016-07-30 NOTE — Progress Notes (Deleted)
DISCHARGE SUMMARY  Zayde Stroupe  MR#: [id number]  DOB:11/03/1945  Date of Admission: 07/23/2016 Date of Discharge: 07/30/2016  Attending Physician:Frantz Quattrone T  Patient's PCP:Pcp Not In System  Consults: PCCM [facility]   Disposition: D/C to [facility]  Follow-up Appts: Ongoing care will be provided by the MD of record at the patient's SNF   Tests Needing Follow-up: -swallow eval is suggested to determine if his diet can be resumed/advanced -ongoing vent weaning should be carried out -ongoing SLP therapy w/ use of PMV should be provided   Discharge Diagnoses: S/P PEA cardiac arrest 2/2 respiratory arrest due to mucous plug in trach  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 Patient suffered a cardiac arrest at [facility] with 2 rounds of CPR at [facility] and one round at [facility]. They were unable to move air after the third code and decision was made to intubate from above and remove trach. EDP communicated with sister who is hismain decision maker and full code status confirmed. Patient remained completely unresponsive and in refractory shock post third code and PCCM was called to admit.  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 2/2 Mucus Plug  Weaning per PCCM - doing well on vent   MRSA RLL Pneumonia  Completed 7 day course of Vancomycin   Anemia No evidence of bleeding - felt to be due to critical illness - hemoglobin holding steady at time  of d/c   CAD s/p BMS to RCA in 2008 - MI/arrest 05/27/16 tx at [facility] Cards has seen in consultation - this arrest was felt to have been respiratory in etiology  HTN Blood pressure controlled  DM CBG well controlled   COPD on home O2 Well compensated at present - no wheezing at time of d/c   Hypokalemia Improved with supplementation - magnesium is acceptable  PVD [facility] notes confirm B iliac stents which require continued use of Plavix - have resumed   Allergies as of 07/30/2016      Reactions   Azithromycin Anaphylaxis   Fentanyl Anaphylaxis   Tolerated oxycodone at [facility]   Acetaminophen Other (See Comments)   angioedema   Morphine And Related Itching   Tolerated oxycodone at [facility]      Medication List    STOP taking these medications   clonazePAM 0.25 MG disintegrating tablet Commonly known as:  KLONOPIN   gabapentin 250 MG/5ML solution Commonly known as:  NEURONTIN   HYDRALAZINE HCL IJ   lidocaine 5 % Commonly known as:  LIDODERM   Melatonin 3 MG Tabs   phenol 1.4 % Liqd Commonly known as:  CHLORASEPTIC   SENNA S 8.6-50 MG tablet Generic drug:  senna-docusate   sodium bicarbonate 650 MG tablet   tizanidine 2 MG capsule Commonly known as:  ZANAFLEX     TAKE these medications   albuterol (2.5 MG/3ML) 0.083% nebulizer solution Commonly known as:  PROVENTIL Take 3 mLs (2.5 mg total) by nebulization every 2 (two) hours as  needed for wheezing or shortness of breath.   albuterol (2.5 MG/3ML) 0.083% nebulizer solution Commonly known as:  PROVENTIL Take 3 mLs (2.5 mg total) by nebulization 3 (three) times daily.   aspirin 81 MG chewable tablet Place 81 mg into feeding tube daily.   chlorhexidine 0.12 % solution Commonly known as:  PERIDEX Use as directed 15 mLs in the mouth or throat every 12 (twelve) hours.   clopidogrel 75 MG tablet Commonly known as:  PLAVIX Place 75 mg into feeding tube daily.   feeding supplement (PRO-STAT SUGAR  FREE 64) Liqd Place 30 mLs into feeding tube 4 (four) times daily.   feeding supplement (VITAL HIGH PROTEIN) Liqd liquid Place 1,000 mLs into feeding tube daily.   furosemide 40 MG tablet Commonly known as:  LASIX Place 40 mg into feeding tube daily.   heparin 5000 UNIT/ML injection Inject 5,000 Units into the skin every 8 (eight) hours.   insulin regular 250 units/2.96mL (100 units/mL) injection Commonly known as:  NOVOLIN R,HUMULIN R Inject 0-16 Units into the skin 3 (three) times daily before meals. Inject as per sliding scale if 70-150=0 for blood glucose 70mg /dl implement hypoglycemia protocol; 151-200=4 units, 201-250=8 units, 251-300=10 units, 301-350=12 units, 351-400=16 units, >400 give 16 units, notify MD, subcutaneously twice daily for DM2   levothyroxine 125 MCG tablet Commonly known as:  SYNTHROID, LEVOTHROID Place 125 mcg into feeding tube daily before breakfast.   metoprolol tartrate 25 MG tablet Commonly known as:  LOPRESSOR Place 1 tablet (25 mg total) into feeding tube 2 (two) times daily. What changed:  when to take this   naphazoline-glycerin 0.012-0.2 % Soln Commonly known as:  CLEAR EYES Place 1-2 drops into both eyes 4 (four) times daily as needed for irritation.   ondansetron 4 MG/5ML solution Commonly known as:  ZOFRAN Place 4 mg into feeding tube every 6 (six) hours as needed for nausea or vomiting.   oxyCODONE 5 MG immediate release tablet Commonly known as:  Oxy IR/ROXICODONE Place 5 mg into feeding tube every 6 (six) hours as needed for severe pain.   pantoprazole sodium 40 mg/20 mL Pack Commonly known as:  PROTONIX Place 40 mg into feeding tube daily.   polyethylene glycol packet Commonly known as:  MIRALAX / GLYCOLAX Place 17 g into feeding tube daily.   potassium chloride 20 MEQ/15ML (10%) Soln Place 30 mLs (40 mEq total) into feeding tube daily. Start taking on:  07/31/2016       Day of Discharge BP 123/72   Pulse 93   Temp 98.4  F (36.9 C) (Oral)   Resp (!) 23   Ht 5' 8.9" (1.75 m)   Wt 97.1 kg (214 lb)   SpO2 96%   BMI 31.70 kg/m   Physical Exam: General: No acute respiratory distress - speaking clearly w/ PMV Lungs: Clear to auscultation bilaterally without wheezes or crackles Cardiovascular: Regular rate and rhythm without murmur gallop or rub  Abdomen: Nontender, nondistended, soft, bowel sounds positive, no rebound, no ascites, no appreciable mass - PEG insertion clean and dry  Extremities: No significant cyanosis, clubbing, or edema bilateral lower extremities  Basic Metabolic Panel:  Recent Labs Lab 07/24/16 0440 07/24/16 0944 07/24/16 1610 07/25/16 0449 07/25/16 1700 07/26/16 0555 07/27/16 0628 07/28/16 0426 07/29/16 0800 07/30/16 0510  NA 136  --  138 140 142 141 141 138 141 141  K 3.1*  --  3.3* 2.9* 3.9 3.3* 3.3* 3.7 3.9 3.5  CL 100*  --  102 104  106 106 105 105 104 102  CO2 25  --  29 27 29 28 28 28 28 29   GLUCOSE 165*  --  121* 119* 110* 110* 121* 177* 114* 110*  BUN 14  --  18 19 16 17 20  21* 25* 33*  CREATININE 0.92  --  0.99 0.83 0.84 0.78 0.87 0.84 0.86 0.95  CALCIUM 7.5*  --  8.2* 7.9* 8.2* 8.4* 8.8* 8.7* 8.9 9.1  MG 1.5* 2.0 2.1 1.9 1.9  --   --  1.7 1.7 1.8  PHOS 2.6 2.4* 2.1* 1.8* 3.3  --   --   --   --   --     Liver Function Tests:  Recent Labs Lab 07/23/16 1540 07/27/16 0628 07/28/16 0426  AST 53* 27 30  ALT 29 21 26   ALKPHOS 86 61 63  BILITOT 0.8 0.9 1.1  PROT 7.7 7.1 7.2  ALBUMIN 2.4* 2.4* 2.4*    Coags:  Recent Labs Lab 07/23/16 1540  INR 1.24    CBC:  Recent Labs Lab 07/23/16 1540  07/26/16 0555 07/27/16 0628 07/28/16 0426 07/29/16 0800 07/30/16 0510  WBC 21.1*  < > 11.2* 13.3* 12.2* 12.2* 13.1*  NEUTROABS 18.0*  --   --   --   --   --   --   HGB 9.9*  < > 8.0* 8.4* 8.3* 8.4* 8.7*  HCT 33.0*  < > 26.0* 27.1* 26.8* 27.7* 28.2*  MCV 91.9  < > 90.3 90.6 90.8 91.7 91.9  PLT 342  < > 251 246 262 260 292  < > = values in this interval not  displayed.   CBG:  Recent Labs Lab 07/29/16 2027 07/30/16 0003 07/30/16 0339 07/30/16 0713 07/30/16 1200  GLUCAP 72 97 105* 118* 114*    Recent Results (from the past 240 hour(s))  Urine culture     Status: Abnormal   Collection Time: 07/23/16 11:26 AM  Result Value Ref Range Status   Specimen Description URINE, RANDOM  Final   Special Requests NONE  Final   Culture MULTIPLE SPECIES PRESENT, SUGGEST RECOLLECTION (A)  Final   Report Status 07/24/2016 FINAL  Final  Culture, blood (Routine X 2) w Reflex to ID Panel     Status: None   Collection Time: 07/23/16  2:44 PM  Result Value Ref Range Status   Specimen Description BLOOD BLOOD LEFT HAND  Final   Special Requests IN PEDIATRIC BOTTLE 1CC  Final   Culture NO GROWTH 5 DAYS  Final   Report Status 07/28/2016 FINAL  Final  Culture, blood (Routine X 2) w Reflex to ID Panel     Status: None   Collection Time: 07/23/16  2:44 PM  Result Value Ref Range Status   Specimen Description BLOOD BLOOD LEFT HAND  Final   Special Requests IN PEDIATRIC BOTTLE 1CC  Final   Culture NO GROWTH 5 DAYS  Final   Report Status 07/28/2016 FINAL  Final  MRSA PCR Screening     Status: None   Collection Time: 07/23/16  3:04 PM  Result Value Ref Range Status   MRSA by PCR NEGATIVE NEGATIVE Final    Comment:        The GeneXpert MRSA Assay (FDA approved for NASAL specimens only), is one component of a comprehensive MRSA colonization surveillance program. It is not intended to diagnose MRSA infection nor to guide or monitor treatment for MRSA infections.   Culture, respiratory (NON-Expectorated)     Status: None  Collection Time: 07/23/16  3:50 PM  Result Value Ref Range Status   Specimen Description TRACHEAL ASPIRATE  Final   Special Requests NONE  Final   Gram Stain   Final    FEW WBC PRESENT,BOTH PMN AND MONONUCLEAR RARE GRAM POSITIVE COCCI    Culture   Final    MODERATE METHICILLIN RESISTANT STAPHYLOCOCCUS AUREUS   Report Status  07/26/2016 FINAL  Final   Organism ID, Bacteria METHICILLIN RESISTANT STAPHYLOCOCCUS AUREUS  Final      Susceptibility   Methicillin resistant staphylococcus aureus - MIC*    CIPROFLOXACIN >=8 RESISTANT Resistant     ERYTHROMYCIN >=8 RESISTANT Resistant     GENTAMICIN <=0.5 SENSITIVE Sensitive     OXACILLIN >=4 RESISTANT Resistant     TETRACYCLINE <=1 SENSITIVE Sensitive     VANCOMYCIN 1 SENSITIVE Sensitive     TRIMETH/SULFA <=10 SENSITIVE Sensitive     CLINDAMYCIN <=0.25 SENSITIVE Sensitive     RIFAMPIN <=0.5 SENSITIVE Sensitive     Inducible Clindamycin NEGATIVE Sensitive     * MODERATE METHICILLIN RESISTANT STAPHYLOCOCCUS AUREUS     Time spent in discharge (includes decision making & examination of pt): 35 minutes  07/30/2016, 2:12 PM   Lonia BloodJeffrey T. Jaelyn Cloninger, MD [facility] Office  [phone] Pager [phone]  On-Call/Text Page:      [email or web address]      password [email or web address]

## 2016-07-30 NOTE — Progress Notes (Signed)
Patient will discharge to [facility] Anticipated discharge date: 2/7 Family notified: niece, sister [facility] 3:15[facility]  CSW signing off.  Burna SisJenna H. Baeleigh Devincent, LCSW Clinical Social Worker [phone]

## 2016-07-31 LAB — HEMOGLOBIN A1C
Hgb A1c MFr Bld: 5.2 % (ref 4.8–5.6)
MEAN PLASMA GLUCOSE: 103 mg/dL

## 2016-10-14 ENCOUNTER — Other Ambulatory Visit (HOSPITAL_COMMUNITY): Payer: Self-pay | Admitting: Internal Medicine

## 2016-10-14 DIAGNOSIS — R911 Solitary pulmonary nodule: Secondary | ICD-10-CM

## 2016-10-24 ENCOUNTER — Encounter (HOSPITAL_COMMUNITY): Payer: Medicare Other

## 2016-10-31 ENCOUNTER — Other Ambulatory Visit (HOSPITAL_COMMUNITY): Payer: Medicare Other

## 2016-10-31 ENCOUNTER — Encounter (HOSPITAL_COMMUNITY)
Admission: RE | Admit: 2016-10-31 | Discharge: 2016-10-31 | Disposition: A | Discharge status: Home / Self Care | Payer: Medicare Other | Source: Ambulatory Visit | Attending: Internal Medicine | Admitting: Internal Medicine

## 2016-10-31 DIAGNOSIS — R911 Solitary pulmonary nodule: Secondary | ICD-10-CM | POA: Insufficient documentation

## 2016-10-31 LAB — GLUCOSE, CAPILLARY: Glucose-Capillary: 82 mg/dL (ref 65–99)

## 2016-10-31 MED ORDER — FLUDEOXYGLUCOSE F - 18 (FDG) INJECTION
10.7200 | Freq: Once | INTRAVENOUS | Status: AC | PRN
  Administered 2016-10-31: 10.72 via INTRAVENOUS

## 2016-11-11 ENCOUNTER — Emergency Department (HOSPITAL_COMMUNITY): Payer: Medicare Other

## 2016-11-11 ENCOUNTER — Encounter (HOSPITAL_COMMUNITY): Payer: Self-pay | Admitting: Emergency Medicine

## 2016-11-11 ENCOUNTER — Emergency Department (HOSPITAL_COMMUNITY)
Admission: EM | Admit: 2016-11-11 | Discharge: 2016-11-12 | Disposition: A | Discharge status: Other Acute Inpatient Hospital | Payer: Medicare Other | Attending: Emergency Medicine | Admitting: Emergency Medicine

## 2016-11-11 DIAGNOSIS — Z7982 Long term (current) use of aspirin: Secondary | ICD-10-CM | POA: Diagnosis not present

## 2016-11-11 DIAGNOSIS — J449 Chronic obstructive pulmonary disease, unspecified: Secondary | ICD-10-CM | POA: Diagnosis not present

## 2016-11-11 DIAGNOSIS — E119 Type 2 diabetes mellitus without complications: Secondary | ICD-10-CM | POA: Diagnosis not present

## 2016-11-11 DIAGNOSIS — J9509 Other tracheostomy complication: Secondary | ICD-10-CM | POA: Diagnosis not present

## 2016-11-11 DIAGNOSIS — Z87891 Personal history of nicotine dependence: Secondary | ICD-10-CM | POA: Insufficient documentation

## 2016-11-11 DIAGNOSIS — I10 Essential (primary) hypertension: Secondary | ICD-10-CM | POA: Insufficient documentation

## 2016-11-11 DIAGNOSIS — Z794 Long term (current) use of insulin: Secondary | ICD-10-CM | POA: Insufficient documentation

## 2016-11-11 DIAGNOSIS — Z431 Encounter for attention to gastrostomy: Secondary | ICD-10-CM | POA: Diagnosis present

## 2016-11-11 DIAGNOSIS — I2510 Atherosclerotic heart disease of native coronary artery without angina pectoris: Secondary | ICD-10-CM | POA: Diagnosis not present

## 2016-11-11 DIAGNOSIS — Z430 Encounter for attention to tracheostomy: Secondary | ICD-10-CM

## 2016-11-11 LAB — CBC WITH DIFFERENTIAL/PLATELET
BASOS PCT: 1 %
Basophils Absolute: 0 10*3/uL (ref 0.0–0.1)
EOS PCT: 5 %
Eosinophils Absolute: 0.4 10*3/uL (ref 0.0–0.7)
HCT: 34.4 % — ABNORMAL LOW (ref 39.0–52.0)
HEMOGLOBIN: 11 g/dL — AB (ref 13.0–17.0)
Lymphocytes Relative: 27 %
Lymphs Abs: 2.3 10*3/uL (ref 0.7–4.0)
MCH: 29.3 pg (ref 26.0–34.0)
MCHC: 32 g/dL (ref 30.0–36.0)
MCV: 91.7 fL (ref 78.0–100.0)
Monocytes Absolute: 0.5 10*3/uL (ref 0.1–1.0)
Monocytes Relative: 6 %
NEUTROS PCT: 61 %
Neutro Abs: 5.1 10*3/uL (ref 1.7–7.7)
PLATELETS: 223 10*3/uL (ref 150–400)
RBC: 3.75 MIL/uL — AB (ref 4.22–5.81)
RDW: 15 % (ref 11.5–15.5)
WBC: 8.3 10*3/uL (ref 4.0–10.5)

## 2016-11-11 LAB — I-STAT CHEM 8, ED
BUN: 13 mg/dL (ref 6–20)
CALCIUM ION: 1.15 mmol/L (ref 1.15–1.40)
Chloride: 96 mmol/L — ABNORMAL LOW (ref 101–111)
Creatinine, Ser: 0.9 mg/dL (ref 0.61–1.24)
GLUCOSE: 90 mg/dL (ref 65–99)
HCT: 33 % — ABNORMAL LOW (ref 39.0–52.0)
Hemoglobin: 11.2 g/dL — ABNORMAL LOW (ref 13.0–17.0)
Potassium: 4 mmol/L (ref 3.5–5.1)
SODIUM: 139 mmol/L (ref 135–145)
TCO2: 33 mmol/L (ref 0–100)

## 2016-11-11 LAB — I-STAT TROPONIN, ED: TROPONIN I, POC: 0.02 ng/mL (ref 0.00–0.08)

## 2016-11-11 NOTE — ED Notes (Addendum)
Explained delay to patient and family. Offered sandwichs and drinks. Family expressed understanding. [facility] and [facility] transport are behind on transports
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
## 2016-11-11 NOTE — Care Management (Signed)
2 portable oxygen [facility]. CM updated Dr. Dalene SeltzerSchlossman regarding discussion with patient concerning leaving here to go [facility]. Patient and Family now states that patient was having pain at the [facility] today when they were attempting to remove trach, patient then requested that he be brought here. CM updated Dr. Dalene SeltzerSchlossman with this information.

## 2016-11-11 NOTE — ED Notes (Signed)
[facility] called for transport. 

## 2016-11-11 NOTE — Care Management (Signed)
CM met with patient and sister Jinger Neighbors [phone] on [place] [address] patient in hall bed. Patient and sister reports they left [facility]  because they had been asking to be transferred to [facility] where patient and family lives and it had not happened. Patient still has his trach and PICC line in-place. Patient states, he refuse to return to [facility] despite explaining that patient doesn't not have any acute care needs at this time that require inpatient hospitalization. CM encouraged patient to return and speak with SW to pursue placement to SNF close to home. Patient reiterated he will not return to [facility] states, he plans to leave and go straight to [facility] ED. Patient sister states, she will drive him there. CM explained that patient will need oxygen for discharge. Patient and family are agreeable, CM contacted [facility] DME for discharge tank prior to discharge.  CM updated Jaun RN on [address] [address] about  discharge plan.

## 2016-11-11 NOTE — Discharge Planning (Signed)
[facility] and [facility]W spoke with medical advisor concerning pt returning to [facility].  MA states pt and family will have to work on a different disposition once he returns to [facility]. SNF and/or LTACH placement in not possible from ER at this time.

## 2016-11-11 NOTE — ED Notes (Signed)
Pt family refusing to allow for transport to [facility]. Stating they do no want him to go back to the facility. Social work, MD, and Consulting civil engineercharge RN made aware.

## 2016-11-11 NOTE — Care Management (Signed)
Evening ED CM received handoff from daytime CM patient needing oxygen for discharge.  Patient signed out AMA from [facility] today. CM reviewed record, contacted EDP regarding oxygen orders.

## 2016-11-11 NOTE — ED Notes (Signed)
Upon [facility] assessment pt reported 3/10 chest pain. Per [facility] pt cannot be transported with active chest pain.  Dr. Rhunette CroftNanavati and charge RN notified.  [facility] RN explained reasoning and delay to family and pt.

## 2016-11-11 NOTE — Clinical Social Work Note (Signed)
CSW was consulted because pt does not want to return to [facility]. Pt is from skilled portion of [facility]. Pt is currently trached. Pt's sister Kara Meadmma present. Pt's sister reports [facility]. CSW explained difficulty in process of getting pt admitted into trach SNF from ED. CSW encouraged a transfer from [facility] to another facility however pt's sister states [facility] explained they can not do a transfer out of [place].  Pt's sister reports pt was not getting the care he needs at [facility]. Pt's sister reports facility wanted to take out to trach and the pt was not ready for that. CSW spoke with [facility] and the plan was to decannulate pt for him to return home in a weeks time however, family did not agree. Pt returned to to ED from [facility]. Pt's daughter reports they told [facility] to not save the bed that he was not coming back there. Pt and pt's sister are refusing for pt to return to [facility] at this time. CSW explained pt is getting no medical care at this time and would need to return to [facility].  CSW spoke with [facility] and they haven't done anything with his bed because they were unsure of what would come of this. Pt is from skilled section of [facility]. [facility] reports pt is out of Medicare days, pt ran out on May 5th. Medicaid is still pending. [facility] will take pt back. Medical advisor contacted to staff case. Family is encouraged to seek placement after returning to [facility]. MD notified.  FultonBridget Waynetta Metheny, [place] [phone]

## 2016-11-11 NOTE — ED Triage Notes (Signed)
[facility] wanting trach evaluation. Per EMS, pt has had trach x 4 mo's and was recently moved down to a 4mm in hopes to decannulate today and [facility]. Pt states he is still having many secretions and is afraid to be decannulated. He has had passy muir valve x 2-3wks and is tolerating well. Alert, speaking in full sentences

## 2016-11-11 NOTE — ED Notes (Signed)
Report called to charge RN Thersa Saltammie Davenport at [facility].

## 2016-11-11 NOTE — ED Notes (Signed)
Pt and family notified of delay in transport [facility].

## 2016-11-12 DIAGNOSIS — I2510 Atherosclerotic heart disease of native coronary artery without angina pectoris: Secondary | ICD-10-CM | POA: Diagnosis not present

## 2016-11-12 DIAGNOSIS — J9509 Other tracheostomy complication: Secondary | ICD-10-CM | POA: Diagnosis not present

## 2016-11-12 DIAGNOSIS — Z431 Encounter for attention to gastrostomy: Secondary | ICD-10-CM | POA: Diagnosis present

## 2016-11-12 DIAGNOSIS — E119 Type 2 diabetes mellitus without complications: Secondary | ICD-10-CM | POA: Diagnosis not present

## 2016-11-12 DIAGNOSIS — Z7982 Long term (current) use of aspirin: Secondary | ICD-10-CM | POA: Diagnosis not present

## 2016-11-12 DIAGNOSIS — I10 Essential (primary) hypertension: Secondary | ICD-10-CM | POA: Diagnosis not present

## 2016-11-12 DIAGNOSIS — Z87891 Personal history of nicotine dependence: Secondary | ICD-10-CM | POA: Diagnosis not present

## 2016-11-12 DIAGNOSIS — J449 Chronic obstructive pulmonary disease, unspecified: Secondary | ICD-10-CM | POA: Diagnosis not present

## 2016-11-12 DIAGNOSIS — Z794 Long term (current) use of insulin: Secondary | ICD-10-CM | POA: Diagnosis not present

## 2016-11-12 NOTE — ED Notes (Signed)
[facility] called for transport 

## 2016-11-12 NOTE — ED Provider Notes (Signed)
  Physical Exam  BP (!) 142/65   Pulse (!) 101   Temp 98.8 F (37.1 C)   Resp (!) 22   Ht 6' (1.829 m)   Wt 97.1 kg (214 lb)   SpO2 96%   BMI 29.02 kg/m   Physical Exam  ED Course  Procedures   [facility] was [facility] after Dr. Dalene SeltzerSchlossman took care of the patient. EMTALA was completed, and [facility] came to pick patient up.  [facility] asked if pt had chest pain, and pt reported that he had some discomfort under his trach site. [facility] reported that since there was no cardiac workup done, they were not allowed to take him to [facility] per their protocol - especially since they didn't have 3 paramedics.  Pt reports that he has had some pain under his trach since his trach was down sized last week, and that he has been getting oxycodone for it. I did a trop and EKG and CXR - all reassuring.  Stable for transfer. EMTALA updated - under the reassessment side.        Derwood KaplanNanavati, Katlynn Naser, MD 11/12/16 [phone]

## 2017-09-16 IMAGING — CT NM PET TUM IMG INITIAL (PI) SKULL BASE T - THIGH
8 series · 25 of 25 positions shown · non-contrast
Comparison: Chest radiograph 08/14/2016

CLINICAL DATA: Initial treatment strategy for lung nodule.

EXAM:
NUCLEAR MEDICINE PET SKULL BASE TO THIGH
TECHNIQUE: 10.7 mCi F-18 FDG was injected intravenously. Full-ring PET imaging
was performed from the skull base to thigh after the radiotracer. CT
data was obtained and used for attenuation correction and anatomic
localization.
FASTING BLOOD GLUCOSE:  Value: 82 mg/dl

[Series 3: pet sk_thigh ac · axial · 5.0mm · 4.07mm/px · z∈[-596,+324]mm · 4 of 231 slices shown]
[im 1/231]
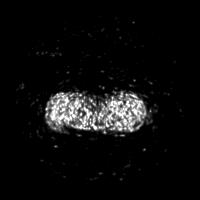
[im 77/231]
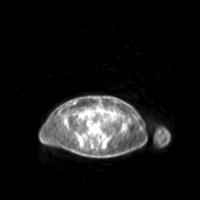
[im 154/231]
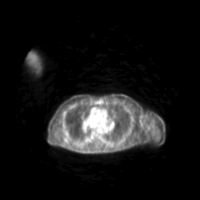
[im 231/231]
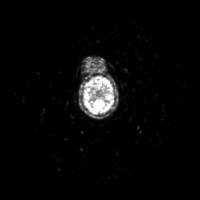

[Series 4: ct sk_thigh 5.0 hd_fov · axial · 5.0mm · 1.17mm/px · z∈[-596,+324]mm · 5 of 231 slices shown]
[im 1/231]
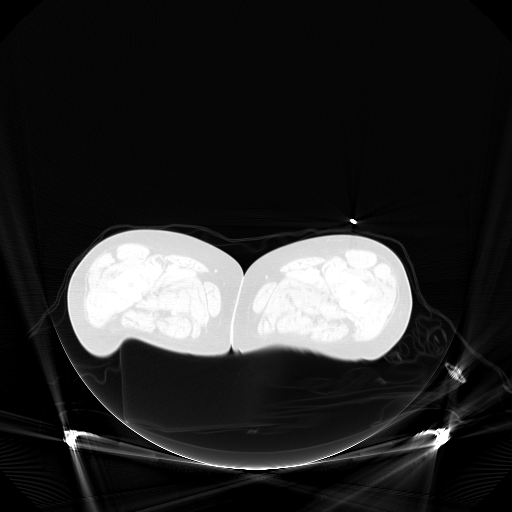
[im 58/231]
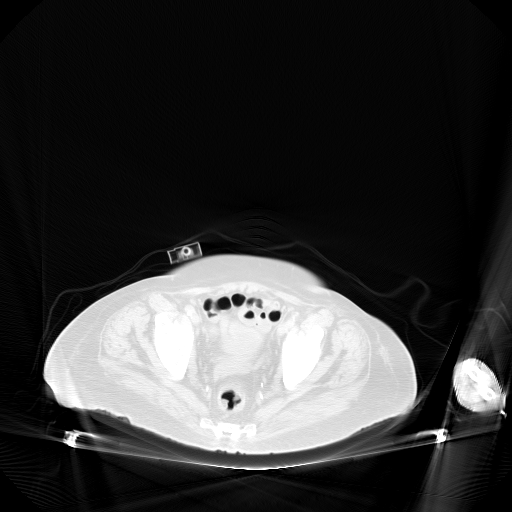
[im 116/231]
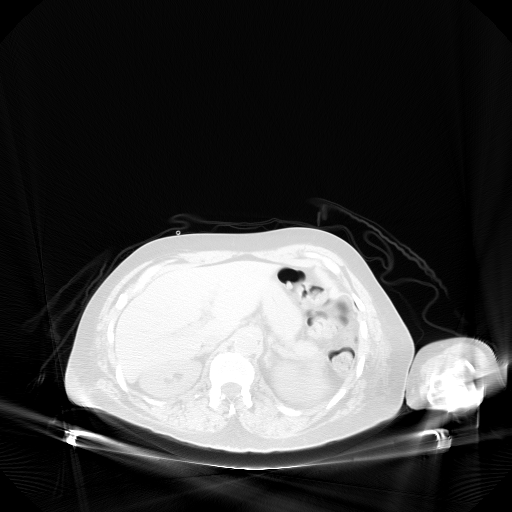
[im 173/231]
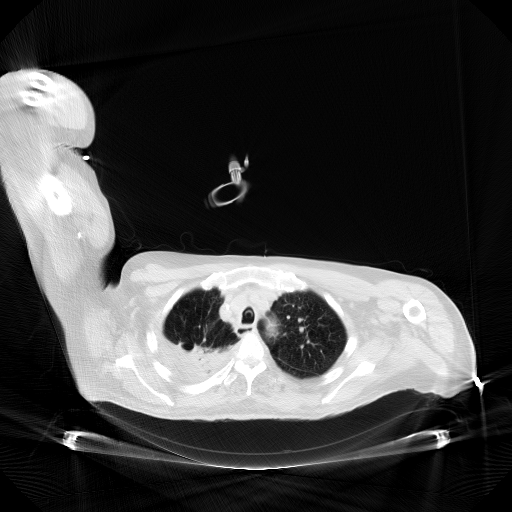
[im 231/231]
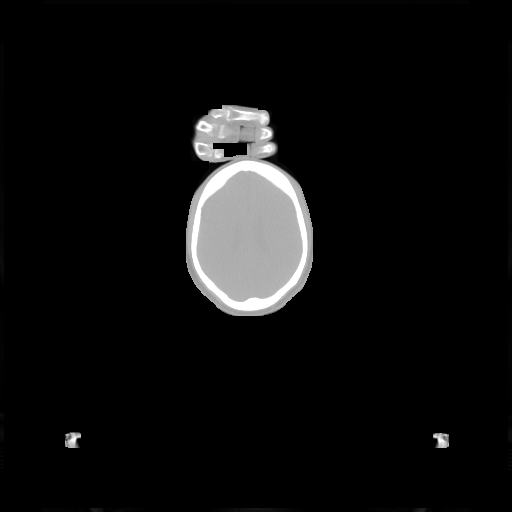

[Series 7: pet sk_thigh nac · axial · 5.0mm · 4.07mm/px · z∈[-596,+324]mm · 5 of 231 slices shown]
[im 1/231]
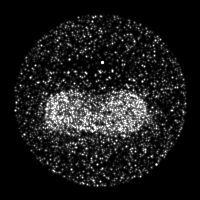
[im 58/231]
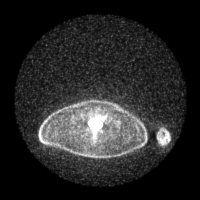
[im 116/231]
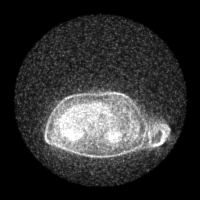
[im 173/231]
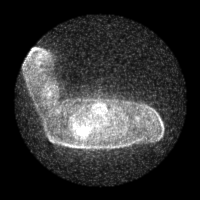
[im 231/231]
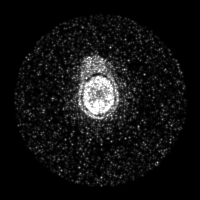

[Series 8: ct sk_thigh 5.0 b70f (id)_bone · axial · 5.0mm · 0.79mm/px · z∈[-122,+178]mm · 2 of 76 slices shown]
[im 1/76  bone]
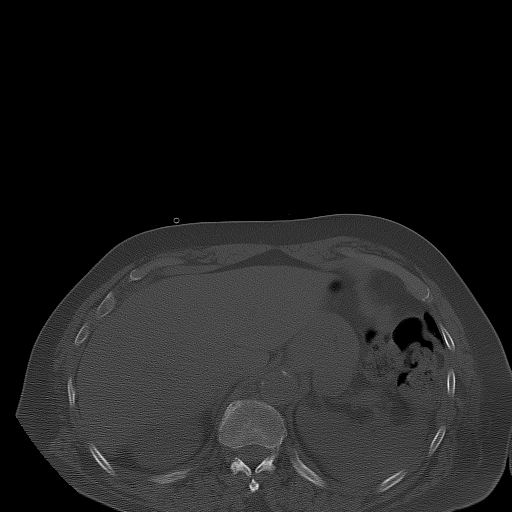
[im 76/76  bone]
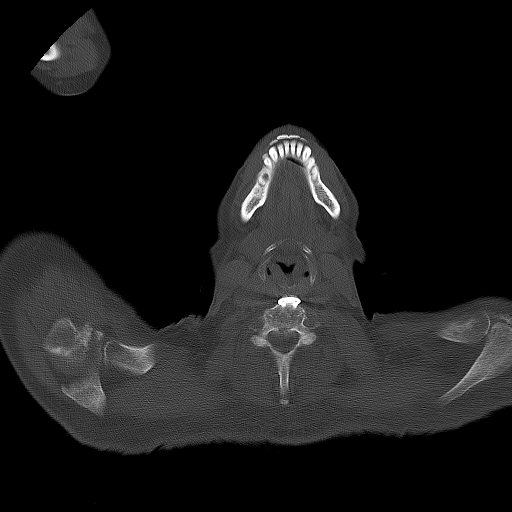

[Series 604: range-ct sk_thigh 5.0 hd_fov-cor-<alpha range> · 2 of 87 slices shown]
[im 1/87]
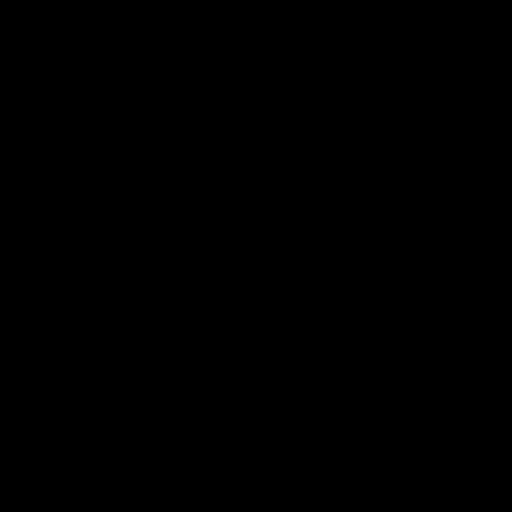
[im 87/87]
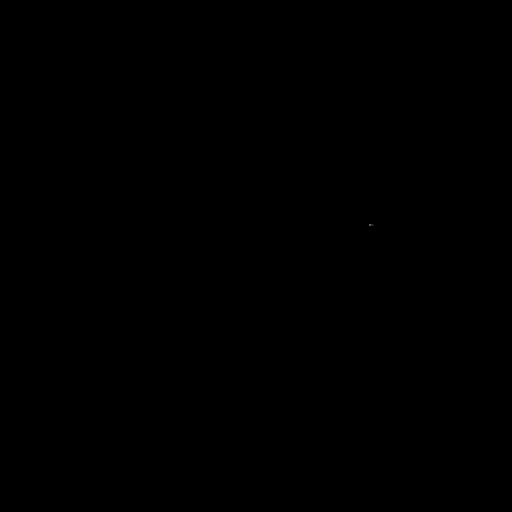

[Series 605: mip collection · coronal · 1.91mm/px · 1 of 32 slices shown]
[im 1/32]
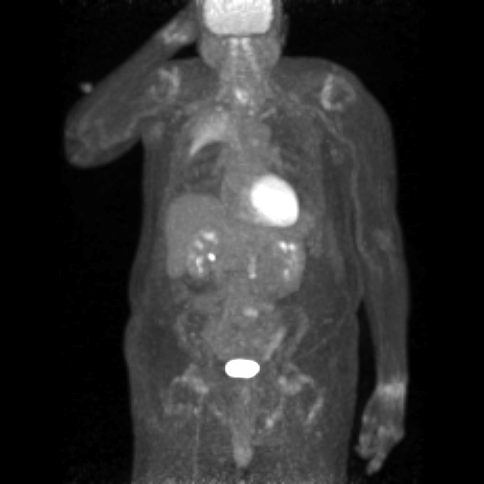

[Series 606: range-ct sk_thigh 5.0 hd_fov-tra-<alpha range> · 5 of 226 slices shown]
[im 1/226]
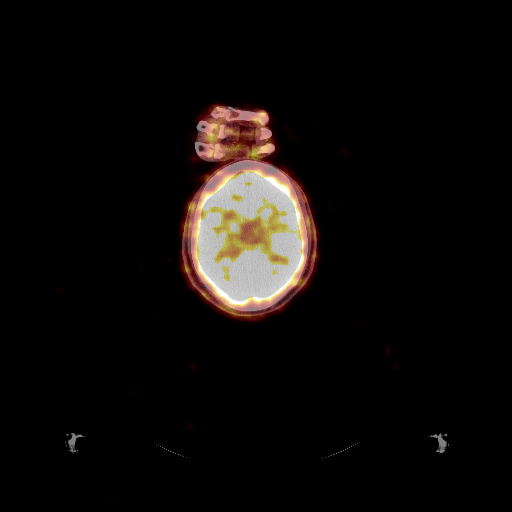
[im 57/226]
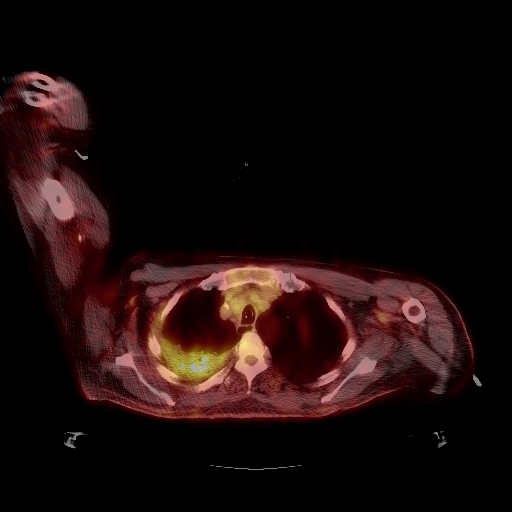
[im 113/226]
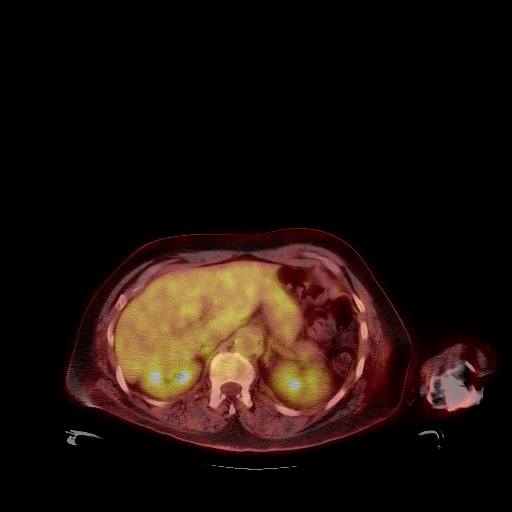
[im 169/226]
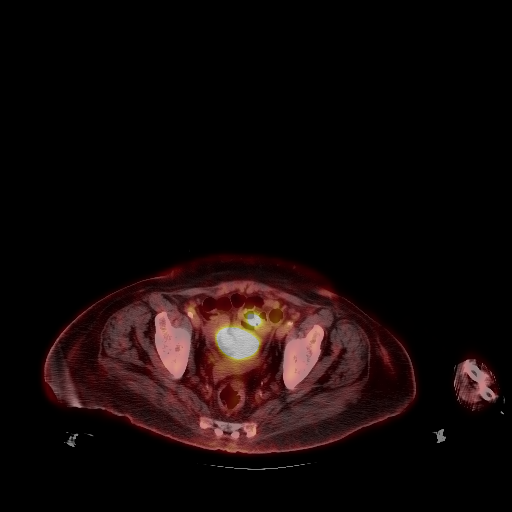
[im 226/226]
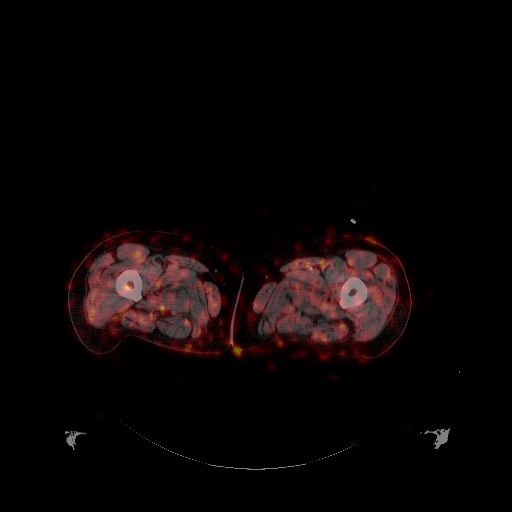

[Series 1032: results mm oncology reading · 5.0mm · 0.79mm/px · 1 of 3 slices shown]
[im 1/3]
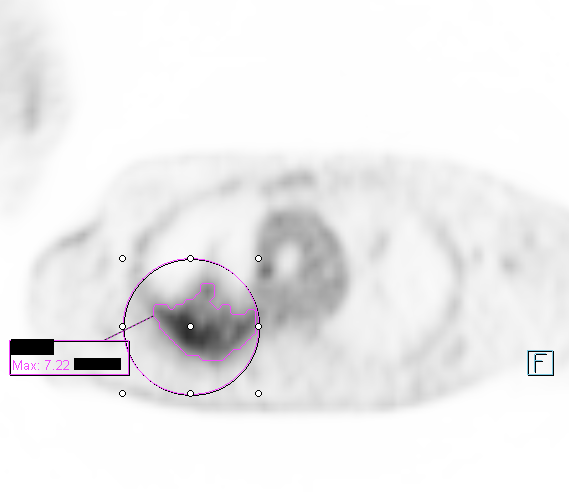

[25 of 25 positions shown; findings below may reference images not displayed]

FINDINGS: NECK

Physiologic activity in the strap musculature of the neck.
Accentuated activity at the tracheostomy site. No hypermetabolic
adenopathy in the neck.

CHEST

Notable centrilobular emphysema. Bilateral airway thickening. Debris
in the right mainstem bronchus, image 33/8. Coronary artery
atherosclerosis.

Consolidation in the right upper lobe and potentially superior
segment right lower lobe with accentuated metabolic activity,
maximum SUV 7.2. There is a cluster of pulmonary nodules in the left
upper lobe with the uppermost nodule measuring 1.4 by 0.6 cm,
maximum SUV of this nodule is 1.9 and the other nodules are similar,
demonstrating low-grade activity.

Mildly hypermetabolic AP window lymph node measures 1.4 cm in short
axis on image 69/4 and has maximum SUV of 4.6. Lower right
paratracheal lymph node measuring 1.2 cm in short axis on image 71/4
has a maximum SUV of 4.6. Background mediastinal activity 2.8.

ABDOMEN/PELVIS

High activity along a apparent prior gastrostomy site. Prominent
stool throughout the colon favors constipation. Aortoiliac
atherosclerotic vascular disease. There is some scattered
physiologic activity in bowel.

Bilateral iliac stents.

SKELETON

Accentuated activity along heterotopic calcification along the
posterior margins of the acetabula bilaterally, felt to be benign.
IMPRESSION: 1. Right upper lobe and superior segment right lower lobe
consolidation with accentuated metabolic activity up to 7.2.
Possibilities include treated malignancy with radiation pneumonitis
; acute pneumonia potentially with some granulomatous response; or
less likely underlying low-grade tumor with surrounding
postobstructive pneumonia and pneumonitis. The activity is greater
peripherally along mass airspace opacity rather than centrally which
would be an unusual pattern for malignancy. There several mildly
hypermetabolic and mildly enlarged mediastinal lymph nodes which may
be reactive or neoplastic. Correlate with patient history.
2. Low-grade activity associated with several of bandlike left upper
lobe nodules. Surveillance is recommended, although these measure
below typical SUV thresholds for malignancy, I do not have
comparison cross-sectional imaging to assess for stability.
3. Accentuated activity associated with tracheostomy site and former
gastrostomy site.
4. Other imaging findings of potential clinical significance: Airway
thickening is present, suggesting bronchitis or reactive airways
disease. Aortic Atherosclerosis (S80PK-ZNS.S) and Emphysema
(S80PK-2IV.C). Coronary atherosclerosis. Bilateral iliac stents.
Prominent stool throughout the colon favors constipation.

## 2022-02-21 DEATH — deceased
